# Patient Record
Sex: Female | Born: 1945 | Race: White | Hispanic: No | Smoking: Never smoker
Health system: Southern US, Community
[De-identification: ages and names within clinical notes are randomized; demographics above are authoritative.]

## PROBLEM LIST (undated history)

## (undated) DIAGNOSIS — J45909 Unspecified asthma, uncomplicated: Secondary | ICD-10-CM

## (undated) DIAGNOSIS — G20A1 Parkinson's disease without dyskinesia, without mention of fluctuations: Secondary | ICD-10-CM

## (undated) DIAGNOSIS — I2510 Atherosclerotic heart disease of native coronary artery without angina pectoris: Secondary | ICD-10-CM

## (undated) DIAGNOSIS — G20 Parkinson's disease: Secondary | ICD-10-CM

## (undated) DIAGNOSIS — G4730 Sleep apnea, unspecified: Secondary | ICD-10-CM

## (undated) DIAGNOSIS — I5033 Acute on chronic diastolic (congestive) heart failure: Principal | ICD-10-CM

## (undated) DIAGNOSIS — N1831 Chronic kidney disease, stage 3a (HCC): Principal | ICD-10-CM

## (undated) DIAGNOSIS — R531 Weakness: Secondary | ICD-10-CM

## (undated) DIAGNOSIS — R0789 Other chest pain: Secondary | ICD-10-CM

## (undated) HISTORY — PX: REPLACEMENT TOTAL KNEE: SUR1224

---

## 2017-09-01 ENCOUNTER — Emergency Department (HOSPITAL_COMMUNITY): Payer: Medicare Other

## 2017-09-01 ENCOUNTER — Encounter (HOSPITAL_COMMUNITY): Payer: Self-pay | Admitting: Emergency Medicine

## 2017-09-01 DIAGNOSIS — R079 Chest pain, unspecified: Secondary | ICD-10-CM | POA: Insufficient documentation

## 2017-09-01 DIAGNOSIS — Z5321 Procedure and treatment not carried out due to patient leaving prior to being seen by health care provider: Secondary | ICD-10-CM | POA: Diagnosis not present

## 2017-09-01 LAB — BASIC METABOLIC PANEL
Anion gap: 9 (ref 5–15)
BUN: 22 mg/dL — AB (ref 6–20)
CALCIUM: 8.9 mg/dL (ref 8.9–10.3)
CHLORIDE: 103 mmol/L (ref 101–111)
CO2: 26 mmol/L (ref 22–32)
CREATININE: 1.02 mg/dL — AB (ref 0.44–1.00)
GFR calc non Af Amer: 53 mL/min — ABNORMAL LOW (ref >60)
Glucose, Bld: 111 mg/dL — ABNORMAL HIGH (ref 65–99)
Potassium: 4 mmol/L (ref 3.5–5.1)
SODIUM: 138 mmol/L (ref 135–145)

## 2017-09-01 LAB — CBC
HCT: 40.8 % (ref 36.0–46.0)
Hemoglobin: 13.2 g/dL (ref 12.0–15.0)
MCH: 26.4 pg (ref 26.0–34.0)
MCHC: 32.4 g/dL (ref 30.0–36.0)
MCV: 81.6 fL (ref 78.0–100.0)
PLATELETS: 236 10*3/uL (ref 150–400)
RBC: 5 MIL/uL (ref 3.87–5.11)
RDW: 15.9 % — AB (ref 11.5–15.5)
WBC: 6.5 10*3/uL (ref 4.0–10.5)

## 2017-09-01 LAB — I-STAT TROPONIN, ED: TROPONIN I, POC: 0.01 ng/mL (ref 0.00–0.08)

## 2017-09-01 NOTE — ED Triage Notes (Signed)
Pt c/0 5/10 bilateral chest tightness, pt states she things is related to an allergic reaction, but she doesn't know what she is allergic to. Pt took 2 Benadryl pta and her BP medication with no relief.

## 2017-09-02 ENCOUNTER — Emergency Department (HOSPITAL_COMMUNITY)
Admission: EM | Admit: 2017-09-02 | Discharge: 2017-09-02 | Disposition: A | Discharge status: Home / Self Care | Payer: Medicare Other | Attending: Emergency Medicine | Admitting: Emergency Medicine

## 2017-09-02 HISTORY — DX: Sleep apnea, unspecified: G47.30

## 2017-09-02 HISTORY — DX: Unspecified asthma, uncomplicated: J45.909

## 2017-09-02 HISTORY — DX: Parkinson's disease: G20

## 2017-09-02 HISTORY — DX: Atherosclerotic heart disease of native coronary artery without angina pectoris: I25.10

## 2017-09-02 HISTORY — DX: Parkinson's disease without dyskinesia, without mention of fluctuations: G20.A1

## 2017-09-02 NOTE — ED Notes (Signed)
No response when called to room.  

## 2017-09-02 NOTE — ED Notes (Signed)
Pt called for a room, no response 

## 2018-10-26 IMAGING — DX DG CHEST 2V
2 series · 2 of 2 positions shown · non-contrast
Comparison: None.

CLINICAL DATA: Pt complains of [DATE] bilateral chest tightness that
started tonight. Pt states she thinks it is related to an allergic
reaction, but she doesn't know what she is allergic to. Hx of CAD
and asthma. Non-smoker.

EXAM:
CHEST  2 VIEW

[chest lat]
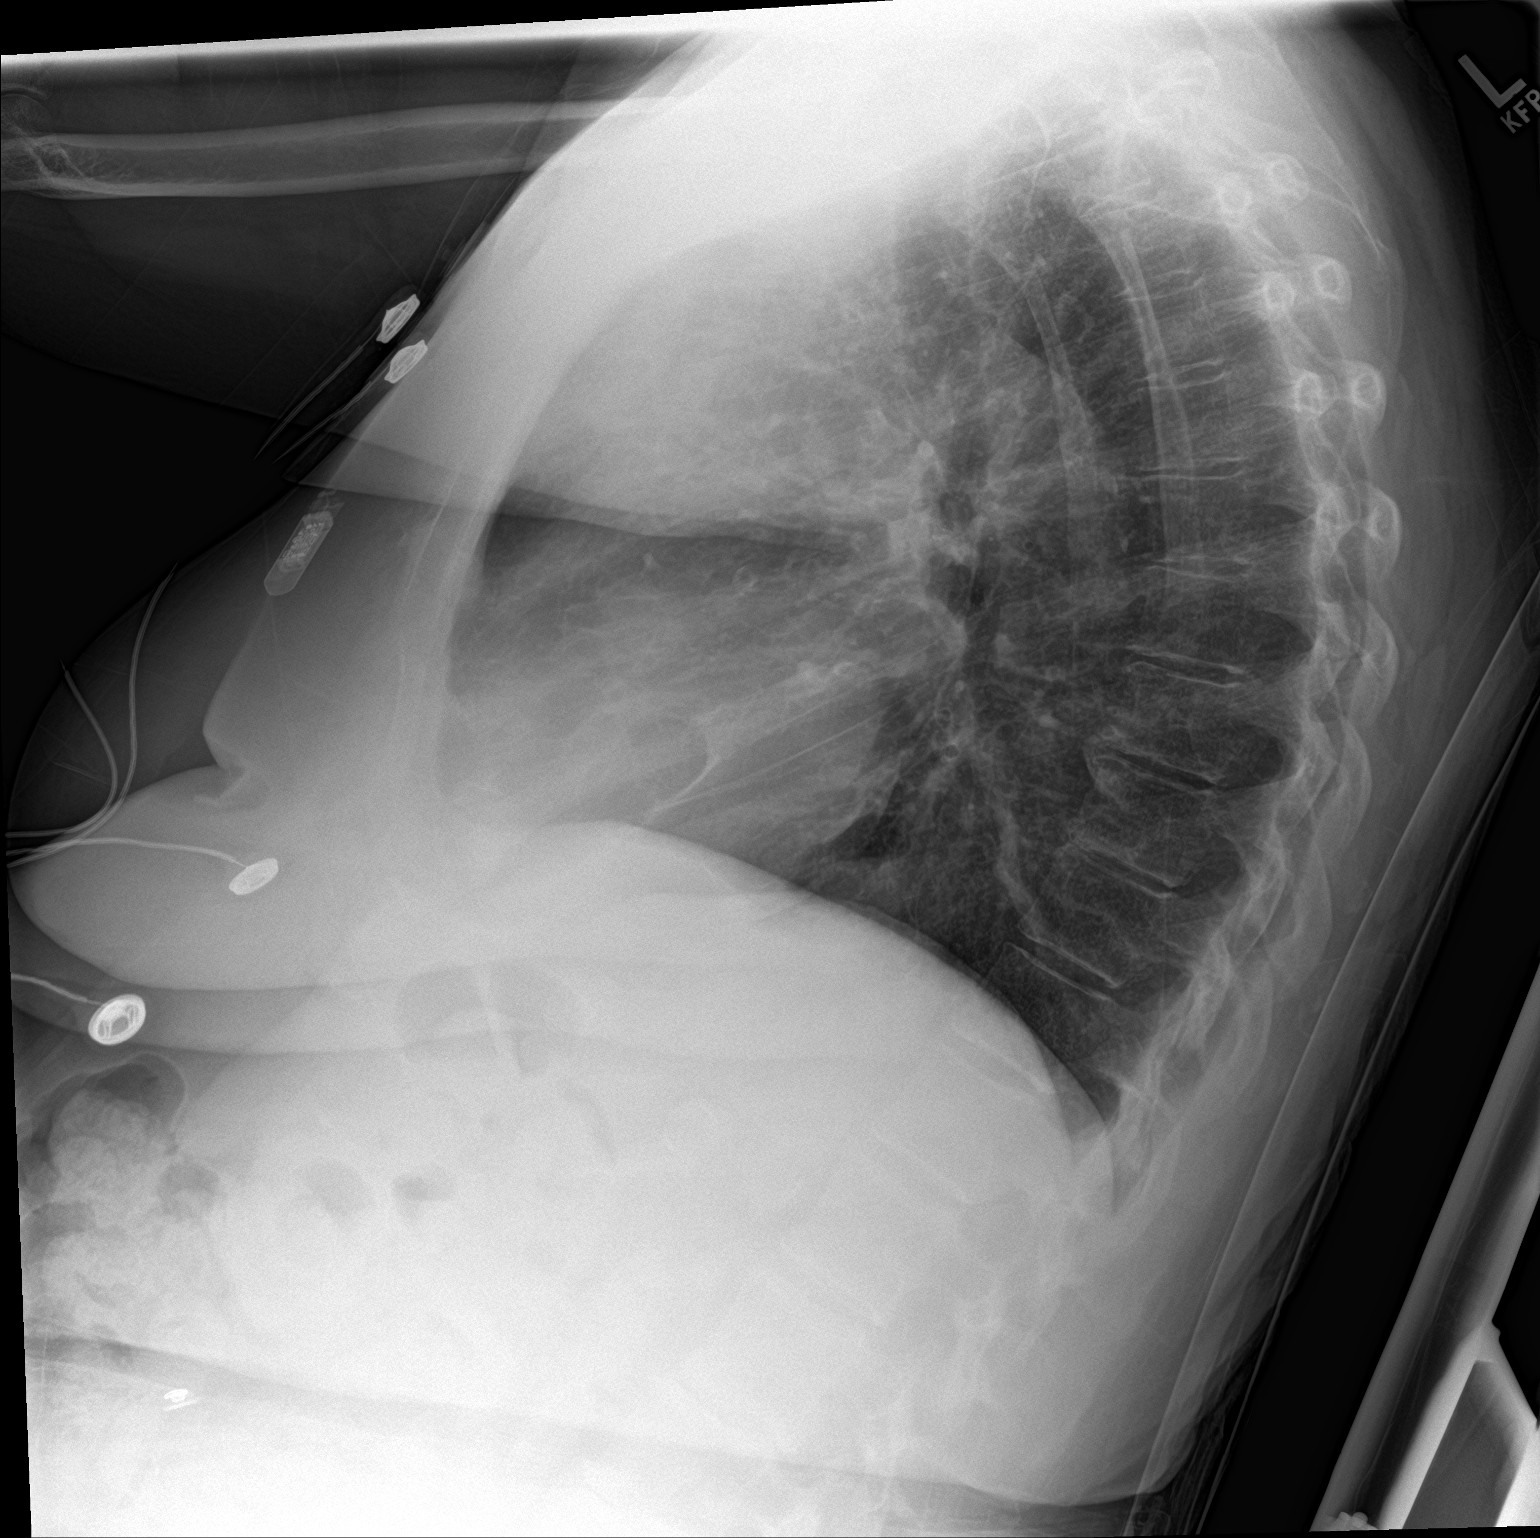

[chest ap]
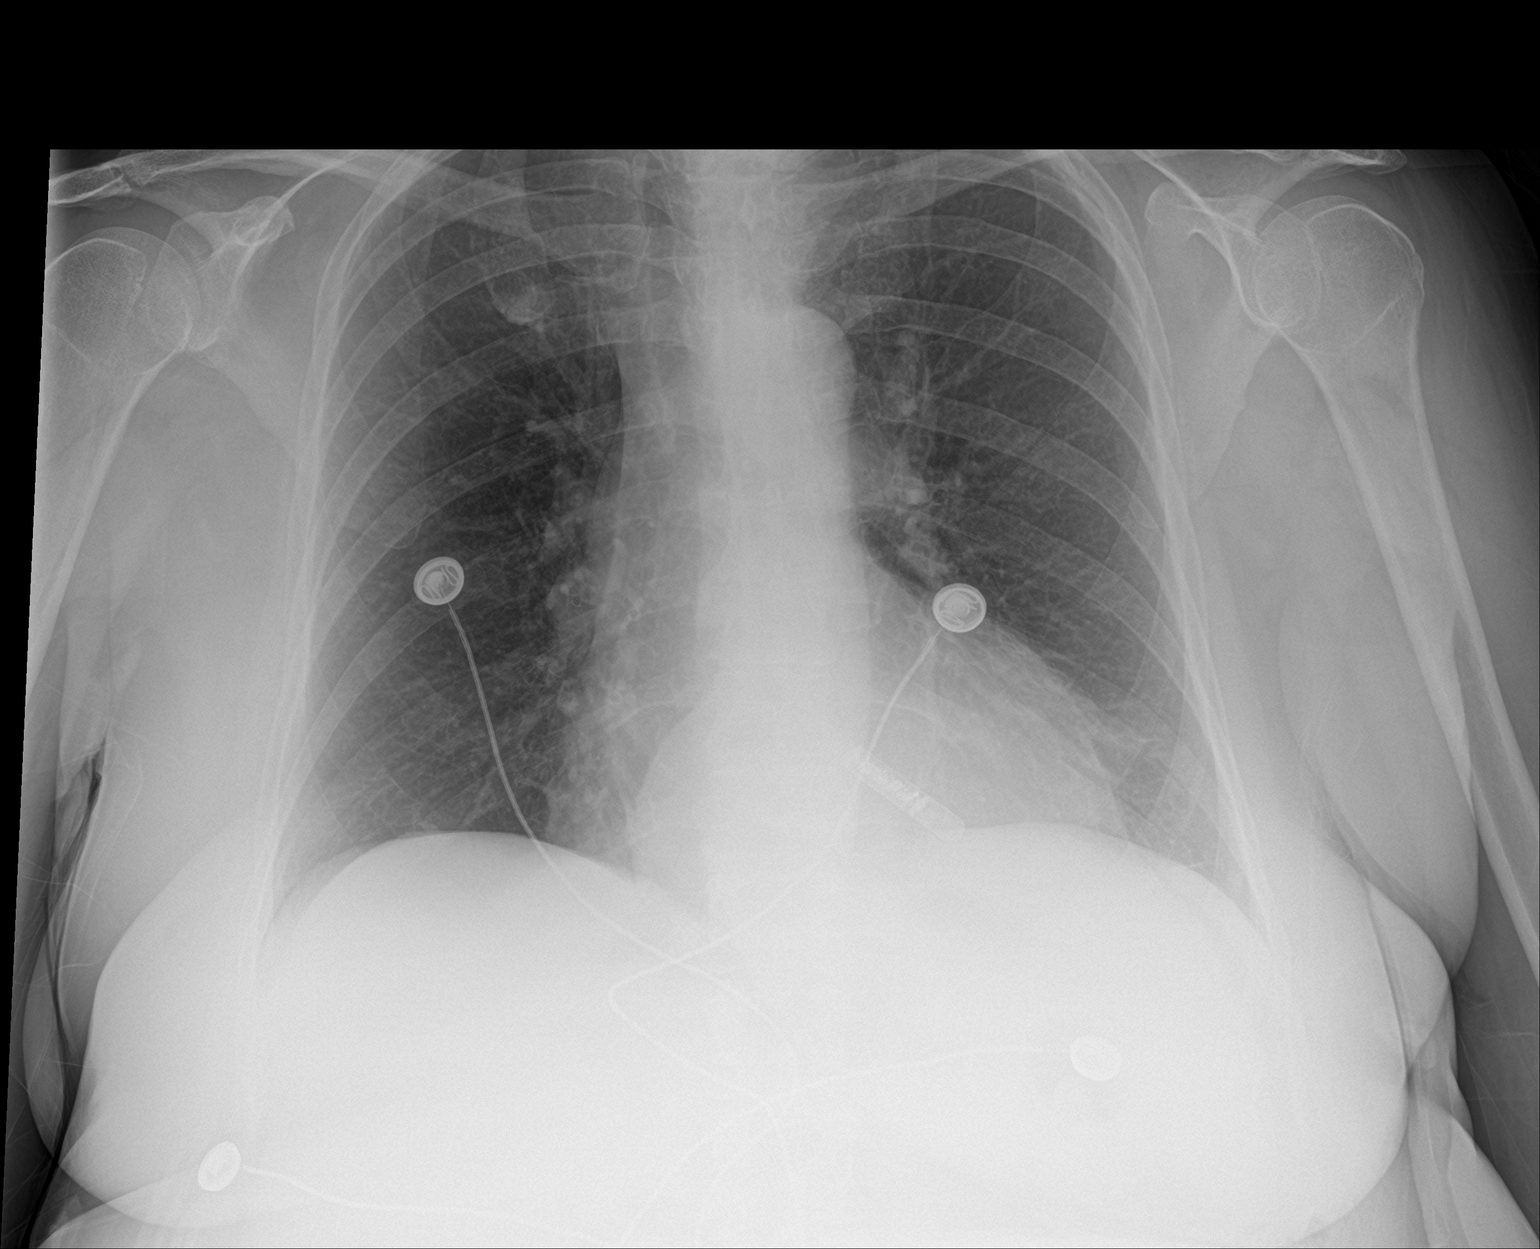

[2 of 2 positions shown; findings below may reference images not displayed]

FINDINGS: Normal cardiac silhouette with ectatic aorta. Loop recorder device
noted. Lungs are clear. No effusion, infiltrate pneumothorax.
IMPRESSION: No acute cardiopulmonary process.

## 2019-01-29 ENCOUNTER — Encounter: Attending: Family Medicine | Primary: Family Medicine

## 2020-04-29 ENCOUNTER — Ambulatory Visit: Admit: 2020-04-29 | Discharge: 2020-04-29 | Payer: MEDICARE | Attending: Family Medicine | Primary: Family Medicine

## 2020-04-29 ENCOUNTER — Ambulatory Visit: Admit: 2020-04-29 | Discharge: 2020-05-03 | Payer: MEDICARE | Primary: Family Medicine

## 2020-04-29 ENCOUNTER — Encounter

## 2020-04-29 DIAGNOSIS — I5033 Acute on chronic diastolic (congestive) heart failure: Secondary | ICD-10-CM

## 2020-04-29 DIAGNOSIS — R531 Weakness: Secondary | ICD-10-CM

## 2020-04-29 MED ORDER — CLOPIDOGREL BISULFATE 75 MG PO TABS
75 MG | ORAL_TABLET | ORAL | 1 refills | Status: DC
Start: 2020-04-29 — End: 2021-01-17

## 2020-04-29 MED ORDER — RESPIRATORY THERAPY SUPPLIES DEVI
Freq: Every day | 0 refills | Status: DC
Start: 2020-04-29 — End: 2021-05-16

## 2020-04-29 MED ORDER — TORSEMIDE 20 MG PO TABS
20 MG | ORAL_TABLET | ORAL | 1 refills | Status: DC
Start: 2020-04-29 — End: 2021-01-17

## 2020-04-29 MED ORDER — CARBIDOPA-LEVODOPA 25-100 MG PO TABS
25-100 MG | ORAL_TABLET | Freq: Three times a day (TID) | ORAL | 1 refills | Status: DC
Start: 2020-04-29 — End: 2020-11-22

## 2020-04-29 MED ORDER — SPIRONOLACTONE 25 MG PO TABS
25 MG | ORAL_TABLET | ORAL | 1 refills | Status: DC
Start: 2020-04-29 — End: 2021-01-17

## 2020-04-30 LAB — LIPID PANEL
Cholesterol, Total: 217 mg/dL — ABNORMAL HIGH (ref 0–199)
HDL: 48 mg/dL (ref >40)
LDL Calculated: 127 mg/dL — ABNORMAL HIGH (ref 0–99)
Triglycerides: 212 mg/dL — ABNORMAL HIGH (ref 0–149)
VLDL Cholesterol Calculated: 42 mg/dL

## 2020-04-30 LAB — VITAMIN D 25 HYDROXY: Vit D, 25-Hydroxy: 25 ng/mL — ABNORMAL LOW (ref 30–100)

## 2020-04-30 LAB — COMPREHENSIVE METABOLIC PANEL
ALT: <5 U/L (ref 0–32)
AST: 15 U/L (ref 0–31)
Albumin: 4.1 g/dL (ref 3.5–5.2)
Alkaline Phosphatase: 87 U/L (ref 35–104)
Anion Gap: 16 mmol/L (ref 7–16)
BUN: 26 mg/dL — ABNORMAL HIGH (ref 6–23)
CO2: 23 mmol/L (ref 22–29)
Calcium: 9.3 mg/dL (ref 8.6–10.2)
Chloride: 103 mmol/L (ref 98–107)
Creatinine: 1.1 mg/dL — ABNORMAL HIGH (ref 0.5–1.0)
GFR African American: 58
GFR Non-African American: 48 mL/min/{1.73_m2} (ref >=60)
Glucose: 104 mg/dL — ABNORMAL HIGH (ref 74–99)
Potassium: 4.8 mmol/L (ref 3.5–5.0)
Sodium: 142 mmol/L (ref 132–146)
Total Bilirubin: 0.4 mg/dL (ref 0.0–1.2)
Total Protein: 7.4 g/dL (ref 6.4–8.3)

## 2020-04-30 LAB — CBC
Hematocrit: 45.2 % (ref 34.0–48.0)
Hemoglobin: 14 g/dL (ref 11.5–15.5)
MCH: 26.7 pg (ref 26.0–35.0)
MCHC: 31 % — ABNORMAL LOW (ref 32.0–34.5)
MCV: 86.1 fL (ref 80.0–99.9)
MPV: 10.1 fL (ref 7.0–12.0)
Platelets: 286 E9/L (ref 130–450)
RBC: 5.25 E12/L (ref 3.50–5.50)
RDW: 14.3 fL (ref 11.5–15.0)
WBC: 8 E9/L (ref 4.5–11.5)

## 2020-04-30 LAB — TSH: TSH: 0.947 u[IU]/mL (ref 0.270–4.200)

## 2020-04-30 LAB — VITAMIN B12: Vitamin B-12: 628 pg/mL (ref 211–946)

## 2020-05-03 ENCOUNTER — Encounter

## 2020-05-27 ENCOUNTER — Inpatient Hospital Stay: Admit: 2020-05-27 | Discharge: 2020-05-27 | Payer: MEDICARE | Primary: Family Medicine

## 2020-05-27 DIAGNOSIS — I5033 Acute on chronic diastolic (congestive) heart failure: Secondary | ICD-10-CM

## 2020-05-27 LAB — BRAIN NATRIURETIC PEPTIDE: Pro-BNP: 552 pg/mL — ABNORMAL HIGH (ref 0–450)

## 2020-05-27 LAB — BASIC METABOLIC PANEL
Anion Gap: 8 mmol/L (ref 7–16)
BUN: 23 mg/dL (ref 6–23)
CO2: 29 mmol/L (ref 22–29)
Calcium: 9.4 mg/dL (ref 8.6–10.2)
Chloride: 100 mmol/L (ref 98–107)
Creatinine: 1.1 mg/dL — ABNORMAL HIGH (ref 0.5–1.0)
GFR African American: 58
GFR Non-African American: 48 mL/min/{1.73_m2} (ref >=60)
Glucose: 100 mg/dL — ABNORMAL HIGH (ref 74–99)
Potassium: 4 mmol/L (ref 3.5–5.0)
Sodium: 137 mmol/L (ref 132–146)

## 2020-05-27 NOTE — Progress Notes (Signed)
Caitlyn Spencer   Caitlyn Spencer  [id number]          Heide Brossart   1945-12-19            Referring Doctor: Huntley Estelle  Primary Care Physician: Huntley Estelle  Cardiologist:   Nephrologist:         History of Present Illness:     Caitlyn Spencer is a 74 y.o. female with a history of HFpEF, most recent EF 47% at [facility].    Patient Story:    She does not  have dyspnea with exertion, shortness of breath, or decline in overall functional capacity. She does not have orthopnea, PND, nocturnal cough or hemoptysis. She does not have abdominal distention or bloating, early satiety, anorexia/change in appetite. She does has a good urinary response to  oral diuretic. She does have  lower extremity edema. She denies lightheadedness, dizziness. She denies palpitations, syncope or near syncope. She does not complain of chest pain, pressure, discomfort.         Allergies   Allergen Reactions   ??? Iodides    ??? Amoxicillin-Pot Clavulanate Swelling     Lips & Tongue   ??? Apixaban Swelling     Edema and petichea   ??? Sulfamethoxazole          No outpatient medications have been marked as taking for the 05/27/20 encounter [facility] Encounter) with [facility] CHF ROOM 1.           Guideline directed medical:  ARNI/ACE I/ARB: No  Beta blocker:  No  Aldosterone antagonist:  Yes        Physical Examination:     BP (!) 148/73    Pulse 58    Resp 16    Wt 229 lb 6.4 oz (104.1 kg)    SpO2 96%    BMI 38.17 kg/m??     Assessment  Charting Type: Shift assessment (CHF Spencer)    Neurological  Level of Consciousness: Alert (0)         HEENT  HEENT (WDL): Exceptions to WDL  Right Eye: Impaired vision  Left Eye: Impaired vision                   Cardiac  Cardiac Rhythm: Sinus bradycardia    Rhythm Interpretation  Pulse: 58         Gastrointestinal  Abdominal (WDL): Within Defined Limits  Abdomen Inspection: Soft, Rounded               Peripheral Vascular  Peripheral Vascular (WDL): Exceptions to WDL  Edema: Right lower extremity, Left lower  extremity  RLE Edema: Trace  LLE Edema: Trace                   Genitourinary  Genitourinary (WDL): Within Defined Limits                             Pulse: 58                   LAB DATA:    BNP  No results for input(s): BNP in the last 72 hours.    proBNP  Recent Labs     05/27/20  1407   PROBNP 552*       BMP  Recent Labs     05/27/20  1407   NA 137   K 4.0   CL 100   CO2 29  BUN 23   CREATININE 1.1*   GLUCOSE 100*   CALCIUM 9.4         WEIGHTS:  Wt Readings from Last 3 Encounters:   05/27/20 229 lb 6.4 oz (104.1 kg)   04/29/20 243 lb (110.2 kg)         TELEMETRY:  Cardiac Regularity: Regular  Cardiac Rhythm/Interpretation: NSR          ASSESSMENT:  [name] is evolemic with stable weights     Interventions completed this visit:  IV diuretics given no  Lab work obtained yes, BNP, BMP   Reviewed  current medications that patient is prescribed and answered any questions   Educated on signs and symptoms of HF  Educated on low sodium diet  Explained CHF Spencer and it's benefits. Patient says she currently does not have a cardiologist and stated she would prefer to wait to set up a follow up CHF Spencer visit and will call for further visits once she has been established with a cardiologist. Referred by PCP.    PLAN:    Given Spencer phone number [phone] and aware of signs and symptoms to call with any HF change in symptoms.

## 2020-05-27 NOTE — Plan of Care (Signed)
Problem: Fluid Volume:  Goal: Risk for excess fluid volume will decrease  Outcome: Ongoing  Continue plan

## 2020-05-31 ENCOUNTER — Ambulatory Visit: Admit: 2020-05-31 | Discharge: 2020-05-31 | Payer: MEDICARE | Attending: Family Medicine | Primary: Family Medicine

## 2020-05-31 DIAGNOSIS — G20 Parkinson's disease: Secondary | ICD-10-CM

## 2020-05-31 MED ORDER — CLOBETASOL PROPIONATE 0.05 % EX OINT
0.05 % | CUTANEOUS | 0 refills | Status: AC
Start: 2020-05-31 — End: ?

## 2020-08-09 MED ORDER — SPIRIVA HANDIHALER 18 MCG IN CAPS
18 MCG | ORAL_CAPSULE | Freq: Every day | RESPIRATORY_TRACT | 5 refills | Status: DC
Start: 2020-08-09 — End: 2021-05-16

## 2020-08-09 NOTE — Telephone Encounter (Signed)
Last Appointment:  05/31/2020  Future Appointments   Date Time Provider Department Center   09/06/2020  2:15 PM Salome Arnt, MD [facility]   09/06/2020  2:30 PM Salome Arnt, MD [facility]

## 2020-09-02 ENCOUNTER — Encounter: Attending: Family Medicine | Primary: Family Medicine

## 2020-09-06 ENCOUNTER — Encounter: Attending: Family Medicine | Primary: Family Medicine

## 2020-09-07 ENCOUNTER — Encounter

## 2020-09-08 LAB — BASIC METABOLIC PANEL
Anion Gap: 13 mmol/L (ref 7–16)
BUN: 22 mg/dL (ref 6–23)
CO2: 27 mmol/L (ref 22–29)
Calcium: 9.2 mg/dL (ref 8.6–10.2)
Chloride: 101 mmol/L (ref 98–107)
Creatinine: 1.1 mg/dL — ABNORMAL HIGH (ref 0.5–1.0)
GFR African American: 58
GFR Non-African American: 48 mL/min/{1.73_m2} (ref >=60)
Glucose: 133 mg/dL — ABNORMAL HIGH (ref 74–99)
Potassium: 4.3 mmol/L (ref 3.5–5.0)
Sodium: 141 mmol/L (ref 132–146)

## 2020-09-09 ENCOUNTER — Ambulatory Visit: Admit: 2020-09-09 | Discharge: 2020-09-09 | Payer: MEDICARE | Attending: Family Medicine | Primary: Family Medicine

## 2020-09-09 DIAGNOSIS — I480 Paroxysmal atrial fibrillation: Secondary | ICD-10-CM

## 2020-09-09 MED ORDER — PANTOPRAZOLE SODIUM 40 MG PO TBEC
40 MG | ORAL_TABLET | Freq: Every day | ORAL | 5 refills | Status: DC
Start: 2020-09-09 — End: 2021-05-16

## 2020-09-09 MED ORDER — BREO ELLIPTA 200-25 MCG/INH IN AEPB
200-25 MCG/INH | RESPIRATORY_TRACT | 5 refills | Status: DC
Start: 2020-09-09 — End: 2021-04-28

## 2020-09-14 ENCOUNTER — Telehealth

## 2020-09-14 NOTE — Telephone Encounter (Signed)
Patient called and wanted to know if she can get a nebulizer machine with the solution sent to [facility]?

## 2020-09-15 MED ORDER — NEBULIZER/TUBING/MOUTHPIECE KIT
PACK | Freq: Every day | 0 refills | Status: DC | PRN
Start: 2020-09-15 — End: 2021-05-16

## 2020-09-15 MED ORDER — ALBUTEROL SULFATE (5 MG/ML) 0.5% IN NEBU
Freq: Four times a day (QID) | RESPIRATORY_TRACT | 3 refills | Status: DC | PRN
Start: 2020-09-15 — End: 2021-05-16

## 2020-09-15 NOTE — Telephone Encounter (Signed)
Spoke to patient and she verbalized understanding on the breo. Patient stated she would like the albuterol solution to use with the machine.

## 2020-09-15 NOTE — Telephone Encounter (Signed)
Yes is she wanting Albuterol solution to use with it? For Breo she would still need to use inhaler

## 2020-11-22 MED ORDER — CARBIDOPA-LEVODOPA 25-100 MG PO TABS
25-100 MG | ORAL_TABLET | ORAL | 5 refills | Status: DC
Start: 2020-11-22 — End: 2020-11-23

## 2020-11-22 NOTE — Telephone Encounter (Signed)
Last Appointment:  09/09/2020  No future appointments.     Caitlyn Spencer asking rx be sent today. She is out.

## 2020-11-23 MED ORDER — CARBIDOPA-LEVODOPA 25-100 MG PO TABS
25-100 MG | ORAL_TABLET | ORAL | 1 refills | Status: DC
Start: 2020-11-23 — End: 2021-05-16

## 2020-11-23 NOTE — Telephone Encounter (Signed)
Last Appointment:  09/09/2020  No future appointments.

## 2021-01-17 MED ORDER — CLOPIDOGREL BISULFATE 75 MG PO TABS
75 MG | ORAL_TABLET | ORAL | 1 refills | Status: DC
Start: 2021-01-17 — End: 2021-05-16

## 2021-01-17 MED ORDER — TORSEMIDE 20 MG PO TABS
20 MG | ORAL_TABLET | ORAL | 1 refills | Status: DC
Start: 2021-01-17 — End: 2021-05-16

## 2021-01-17 MED ORDER — SPIRONOLACTONE 25 MG PO TABS
25 MG | ORAL_TABLET | ORAL | 1 refills | Status: DC
Start: 2021-01-17 — End: 2021-07-11

## 2021-01-17 NOTE — Telephone Encounter (Signed)
Last Appointment:  09/09/2020  No future appointments.

## 2021-04-23 ENCOUNTER — Encounter

## 2021-04-27 ENCOUNTER — Encounter

## 2021-04-27 NOTE — Telephone Encounter (Signed)
Last Appointment:  09/09/2020  No future appointments.

## 2021-04-27 NOTE — Telephone Encounter (Signed)
Patient would like pharmacy changed to [facility]

## 2021-04-28 MED ORDER — BREO ELLIPTA 200-25 MCG/INH IN AEPB
200-25 MCG/INH | RESPIRATORY_TRACT | 0 refills | Status: DC
Start: 2021-04-28 — End: 2021-05-16

## 2021-05-16 ENCOUNTER — Ambulatory Visit: Admit: 2021-05-16 | Discharge: 2021-05-16 | Payer: MEDICARE | Attending: Family Medicine | Primary: Family Medicine

## 2021-05-16 ENCOUNTER — Observation Stay
Admit: 2021-05-17 | Discharge: 2021-05-18 | Disposition: A | Discharge status: Home / Self Care | Payer: MEDICARE | Attending: Emergency Medicine | Admitting: Emergency Medicine

## 2021-05-16 ENCOUNTER — Emergency Department: Admit: 2021-05-17 | Payer: MEDICARE | Primary: Family Medicine

## 2021-05-16 DIAGNOSIS — R079 Chest pain, unspecified: Secondary | ICD-10-CM

## 2021-05-16 LAB — CBC
Hematocrit: 42.6 % (ref 34.0–48.0)
Hemoglobin: 13.3 g/dL (ref 11.5–15.5)
MCH: 25 pg — ABNORMAL LOW (ref 26.0–35.0)
MCHC: 31.2 % — ABNORMAL LOW (ref 32.0–34.5)
MCV: 80.1 fL (ref 80.0–99.9)
MPV: 9.2 fL (ref 7.0–12.0)
Platelets: 321 E9/L (ref 130–450)
RBC: 5.32 E12/L (ref 3.50–5.50)
RDW: 14.7 fL (ref 11.5–15.0)
WBC: 12 E9/L — ABNORMAL HIGH (ref 4.5–11.5)

## 2021-05-16 LAB — TROPONIN: Troponin, High Sensitivity: 9 ng/L (ref 0–9)

## 2021-05-16 LAB — BASIC METABOLIC PANEL
Anion Gap: 7 mmol/L (ref 7–16)
BUN: 22 mg/dL (ref 6–23)
CO2: 27 mmol/L (ref 22–29)
Calcium: 8.9 mg/dL (ref 8.6–10.2)
Chloride: 102 mmol/L (ref 98–107)
Creatinine: 1.2 mg/dL — ABNORMAL HIGH (ref 0.5–1.0)
GFR African American: 53
GFR Non-African American: 44 mL/min/{1.73_m2} (ref >=60)
Glucose: 111 mg/dL — ABNORMAL HIGH (ref 74–99)
Potassium: 4.6 mmol/L (ref 3.5–5.0)
Sodium: 136 mmol/L (ref 132–146)

## 2021-05-16 LAB — APTT: aPTT: 29.1 s (ref 24.5–35.1)

## 2021-05-16 LAB — PROTIME-INR
INR: 1.1
Protime: 11.6 s (ref 9.3–12.4)

## 2021-05-16 MED ORDER — CARBIDOPA-LEVODOPA 25-100 MG PO TABS
25-100 MG | ORAL_TABLET | ORAL | 1 refills | Status: AC
Start: 2021-05-16 — End: ?

## 2021-05-16 MED ORDER — ALBUTEROL SULFATE HFA 108 (90 BASE) MCG/ACT IN AERS
108 | RESPIRATORY_TRACT | 1 refills | Status: AC | PRN
Start: 2021-05-16 — End: ?

## 2021-05-16 MED ORDER — RAMIPRIL 2.5 MG PO CAPS
2.5 | ORAL_CAPSULE | ORAL | 0 refills | Status: AC | PRN
Start: 2021-05-16 — End: ?

## 2021-05-16 MED ORDER — PANTOPRAZOLE SODIUM 40 MG PO TBEC
40 MG | ORAL_TABLET | Freq: Every day | ORAL | 1 refills | Status: DC
Start: 2021-05-16 — End: 2021-05-18

## 2021-05-16 MED ORDER — NORMAL SALINE FLUSH 0.9 % IV SOLN
0.9 % | INTRAVENOUS | Status: DC | PRN
Start: 2021-05-16 — End: 2021-05-18

## 2021-05-16 MED ORDER — SPIRIVA HANDIHALER 18 MCG IN CAPS
18 MCG | ORAL_CAPSULE | Freq: Every day | RESPIRATORY_TRACT | 1 refills | Status: AC
Start: 2021-05-16 — End: ?

## 2021-05-16 MED ORDER — TORSEMIDE 20 MG PO TABS
20 MG | ORAL_TABLET | ORAL | 1 refills | Status: DC
Start: 2021-05-16 — End: 2021-07-11

## 2021-05-16 MED ORDER — FLUTICASONE FUROATE-VILANTEROL 200-25 MCG/INH IN AEPB
200-25 MCG/INH | Freq: Every day | RESPIRATORY_TRACT | 1 refills | Status: AC
Start: 2021-05-16 — End: ?

## 2021-05-16 MED ORDER — CLOPIDOGREL BISULFATE 75 MG PO TABS
75 MG | ORAL_TABLET | ORAL | 1 refills | Status: DC
Start: 2021-05-16 — End: 2021-07-11

## 2021-05-16 NOTE — ED Provider Notes (Signed)
[facility]  Department of Emergency Medicine     Written by: Hyman Bower, DO  Patient Name: Fontaine No  Attending Provider: Mordecai Maes, MD  Admit Date: 05/17/2021  4:34 AM  MRN: [id number]                   DOB: Aug 01, 1946        Chief Complaint   Patient presents with   ??? Shortness of Breath     feeling SOB for awhile, last night stated she started to have CP and pain across her shoulders, extensive hx of MI and dissection   ??? Chest Pain    - Chief complaint    Patient is a 75 year old female past med history of CAD.  Patient presents with chief complaint shortness of breath and chest pain.  Patient states that for the last couple days she has had gradually worsening shortness of breath as well as chest pain.  Patient states that chest pain is a dull aching sensation.  She does note some radiation to her shoulders.  Patient states that symptoms have been moderate in severity and constant onset she denies any exacerbating relieving factors.  Patient notes that she did have a similar episode in the past prior to her previous MI.  Patient denies any fevers, chills, nausea, vomiting, abdominal pain, constipation or diarrhea.    The history is provided by the patient. No language interpreter was used.        Review of Systems   Constitutional: Negative for chills and fever.   HENT: Negative for ear pain, sinus pressure and sore throat.    Eyes: Negative for pain, discharge and redness.   Respiratory: Positive for shortness of breath. Negative for cough and wheezing.    Cardiovascular: Positive for chest pain.   Gastrointestinal: Negative for abdominal distention, diarrhea, nausea and vomiting.   Genitourinary: Negative for dysuria and frequency.   Musculoskeletal: Negative for arthralgias and back pain.   Skin: Negative for rash and wound.   Neurological: Negative for weakness and headaches.   Hematological: Negative for adenopathy.   All other systems reviewed and are negative.        Physical Exam  Vitals and nursing note reviewed.   Constitutional:       General: She is not in acute distress.     Appearance: Normal appearance.   HENT:      Head: Normocephalic and atraumatic.      Nose: No congestion or rhinorrhea.      Mouth/Throat:      Mouth: Mucous membranes are moist.      Pharynx: Oropharynx is clear.   Eyes:      Extraocular Movements: Extraocular movements intact.      Pupils: Pupils are equal, round, and reactive to light.   Cardiovascular:      Rate and Rhythm: Normal rate and regular rhythm.      Heart sounds: No murmur heard.  No gallop.    Pulmonary:      Effort: Pulmonary effort is normal. No respiratory distress.      Breath sounds: No wheezing, rhonchi or rales.   Abdominal:      General: Abdomen is flat.      Palpations: Abdomen is soft. There is no mass.      Tenderness: There is no abdominal tenderness. There is no guarding.      Hernia: No hernia is present.   Musculoskeletal:  General: No swelling, tenderness or signs of injury. Normal range of motion.      Cervical back: Normal range of motion. No rigidity. No muscular tenderness.   Skin:     General: Skin is warm and dry.      Capillary Refill: Capillary refill takes less than 2 seconds.   Neurological:      General: No focal deficit present.      Mental Status: She is alert and oriented to person, place, and time. Mental status is at baseline.   Psychiatric:         Mood and Affect: Mood normal.         Behavior: Behavior normal.          Procedures       MDM  Number of Diagnoses or Management Options  Chest pain, unspecified type  Diagnosis management comments: Patient is a [age] year old female past med history of CAD.  Patient presents with chief complaint of shortness of breath and chest pain.  Vital signs stable presentation.  Physical exam heart regular rate and rhythm, lungs clear to auscultation bilaterally, abdomen soft nontender no rigidity rebound or guarding.  EKG obtained demonstrate no acute  ischemic changes.  Laboratory work obtained CBC unremarkable, BMP demonstrated mildly of a creatinine 1.3, INR 1.1, APTT 29.1.  Troponin was obtained initially 9 on repeat 10.  Chest x-ray obtained demonstrate no acute abnormalities.  Plan consistent with chest pain in the setting of previous CAD.  EKG reassuring troponin reassuring however decision made to admit patient for chest pain work-up.  Patient given aspirin.  Case discussed with hospitalist who agreed to admit patient.  Plan of care discussed with patient include admission, all questions were answered, patient was in agreement plan of care and admitted to the hospital in stable condition.       Amount and/or Complexity of Data Reviewed  Clinical lab tests: reviewed  Tests in the radiology section of CPT??: reviewed  Tests in the medicine section of CPT??: reviewed  Decide to obtain previous medical records or to obtain history from someone other than the patient: yes    Risk of Complications, Morbidity, and/or Mortality  Presenting problems: moderate  Diagnostic procedures: moderate  Management options: moderate    Patient Progress  Patient progress: stable           --------------------------------------------- PAST HISTORY ---------------------------------------------  Past Medical History:  has a past medical history of CAD (coronary artery disease).    Past Surgical History:  has no past surgical history on file.    Social History:  reports that she has never smoked. She has never used smokeless tobacco. She reports previous alcohol use. She reports previous drug use.    Family History: family history is not on file.     The patient???s home medications have been reviewed.    Allergies: Iodides, Amoxicillin-pot clavulanate, Apixaban, Sulfamethoxazole, and Vagisil anti-itch medicated [pramoxine hcl]    -------------------------------------------------- RESULTS -------------------------------------------------    LABS:  Results for orders placed or performed  during the hospital encounter of 05/17/21   Basic metabolic panel   Result Value Ref Range    Sodium 136 132 - 146 mmol/L    Potassium 4.6 3.5 - 5.0 mmol/L    Chloride 102 98 - 107 mmol/L    CO2 27 22 - 29 mmol/L    Anion Gap 7 7 - 16 mmol/L    Glucose 111 (H) 74 - 99 mg/dL    BUN 22 6 -  23 mg/dL    CREATININE 1.2 (H) 0.5 - 1.0 mg/dL    GFR Non-African American 44 >=60 mL/min/1.73    GFR African American 53     Calcium 8.9 8.6 - 10.2 mg/dL   CBC   Result Value Ref Range    WBC 12.0 (H) 4.5 - 11.5 E9/L    RBC 5.32 3.50 - 5.50 E12/L    Hemoglobin 13.3 11.5 - 15.5 g/dL    Hematocrit 16.142.6 09.634.0 - 48.0 %    MCV 80.1 80.0 - 99.9 fL    MCH 25.0 (L) 26.0 - 35.0 pg    MCHC 31.2 (L) 32.0 - 34.5 %    RDW 14.7 11.5 - 15.0 fL    Platelets 321 130 - 450 E9/L    MPV 9.2 7.0 - 12.0 fL   Troponin   Result Value Ref Range    Troponin, High Sensitivity 9 0 - 9 ng/L   Protime-INR   Result Value Ref Range    Protime 11.6 9.3 - 12.4 sec    INR 1.1    APTT   Result Value Ref Range    aPTT 29.1 24.5 - 35.1 sec   Troponin   Result Value Ref Range    Troponin, High Sensitivity 10 (H) 0 - 9 ng/L   EKG 12 Lead   Result Value Ref Range    Ventricular Rate 62 BPM    Atrial Rate 62 BPM    P-R Interval 246 ms    QRS Duration 134 ms    Q-T Interval 432 ms    QTc Calculation (Bazett) 438 ms    P Axis 59 degrees    R Axis 128 degrees    T Axis -31 degrees       RADIOLOGY:  CT CHEST WO CONTRAST   Final Result   Limited evaluation due to noncontrast technique.  No evidence acute aortic   abnormality.             EKG:  This EKG is signed and interpreted by me.    Rate: 62  Rhythm: Sinus  Interpretation: EKG obtained demonstrates sinus rhythm first-degree AV block, rate 62, normal axis, T wave inversions diffusely, no acute ST segment changes.  Comparison: stable as compared to patient's most recent EKG and no previous EKG available      ------------------------- NURSING NOTES AND VITALS REVIEWED ---------------------------  Date / Time Roomed:  05/17/2021   4:34 AM  ED Bed Assignment:  10/10    The nursing notes within the ED encounter and vital signs as below have been reviewed.     Patient Vitals for the past 24 hrs:   BP Temp Pulse Resp SpO2   05/17/21 0515 (!) 143/75 -- 59 15 97 %   05/17/21 0006 (!) 152/67 -- 64 -- 95 %   05/16/21 1656 (!) 150/68 -- -- 16 --   05/16/21 1652 -- 97.5 ??F (36.4 ??C) 69 -- 97 %       Oxygen Saturation Interpretation: Normal    ------------------------------------------ PROGRESS NOTES ------------------------------------------  Re-evaluation(s):  Time: 0420  Patient???s symptoms show no change  Repeat physical examination is not changed    Counseling:  I have spoken with the patient and discussed today???s results, in addition to providing specific details for the plan of care and counseling regarding the diagnosis and prognosis.  Their questions are answered at this time and they are agreeable with the plan of admission.    --------------------------------- ADDITIONAL PROVIDER NOTES ---------------------------------  Consultations:  Time: 0430. Spoke with Hospitalist.  Discussed case.  They will admit the patient.  This patient's ED course included: a personal history and physicial examination, re-evaluation prior to disposition and multiple bedside re-evaluations    This patient has remained hemodynamically stable during their ED course.    Diagnosis:  1. Chest pain, unspecified type        Disposition:  Patient's disposition: Admit to telemetry  Patient's condition is stable.        Patient was seen and evaluated by myself and my attending. Assessment and Plan discussed with attending provider, please see attestation for final plan of care.     Hyman Bower, DO        Soyla Dryer, DO  Resident  05/17/21 0700

## 2021-05-16 NOTE — Progress Notes (Signed)
[facility]    Neha Waight presents to the office today for   Chief Complaint   Patient presents with   ??? Medication Refill   ??? Shortness of Breath     ongoing for weeks, was given steroids that helped but did not complete   ??? Gastroesophageal Reflux   ??? Chest Pain   ??? Constipation     bm yesterday small and hard   ??? Hair/Scalp Problem     itching, flaking   ??? CPAP/BiPAP     [facility]     Chest pain and dyspnea  Last night with pain across her shoulders  She is short of breath and worsened with exertion  She does not have chest pain now but had some walking in  She does have known Coronary artery disease  Did not see Dr. Craig Guess as she was out of town  She is on Plavix and not Aspirin      Review of Systems     BP 134/60    Pulse 66    Temp 96.9 ??F (36.1 ??C) (Temporal)    Ht 5\' 5"  (1.651 m)    Wt 232 lb (105.2 kg)    SpO2 95%    BMI 38.61 kg/m??   Physical Exam  Constitutional:       Appearance: Normal appearance.   HENT:      Head: Normocephalic and atraumatic.   Eyes:      Extraocular Movements: Extraocular movements intact.      Conjunctiva/sclera: Conjunctivae normal.   Cardiovascular:      Rate and Rhythm: Normal rate.      Heart sounds: Normal heart sounds.   Pulmonary:      Effort: Pulmonary effort is normal.      Breath sounds: Normal breath sounds.   Skin:     General: Skin is warm.   Neurological:      Mental Status: She is alert and oriented to person, place, and time.   Psychiatric:         Mood and Affect: Mood normal.         Behavior: Behavior normal.            Current Outpatient Medications:   ???  torsemide (DEMADEX) 20 MG tablet, TAKE 1 TABLET BY MOUTH ONCE DAILY, Disp: 90 tablet, Rfl: 1  ???  tiotropium (SPIRIVA HANDIHALER) 18 MCG inhalation capsule, Inhale 1 capsule into the lungs daily, Disp: 90 capsule, Rfl: 1  ???  pantoprazole (PROTONIX) 40 MG tablet, Take 1 tablet by mouth daily, Disp: 90 tablet, Rfl: 1  ???  clopidogrel (PLAVIX) 75 MG tablet, TAKE 1 TABLET BY MOUTH ONCE DAILY,  Disp: 90 tablet, Rfl: 1  ???  carbidopa-levodopa (SINEMET) 25-100 MG per tablet, TAKE 1 TABLET BY MOUTH THREE TIMES DAILY, Disp: 270 tablet, Rfl: 1  ???  Fluticasone furoate-vilanterol (BREO ELLIPTA) 200-25 MCG/INH AEPB inhaler, Inhale 1 puff into the lungs daily, Disp: 3 each, Rfl: 1  ???  ramipril (ALTACE) 2.5 MG capsule, Take 1 capsule by mouth as needed (Hypertension), Disp: 30 capsule, Rfl: 0  ???  albuterol sulfate HFA (PROVENTIL;VENTOLIN;PROAIR) 108 (90 Base) MCG/ACT inhaler, Inhale 2 puffs into the lungs as needed for Shortness of Breath, Disp: 2 each, Rfl: 1  ???  spironolactone (ALDACTONE) 25 MG tablet, TAKE 1 TABLET BY MOUTH ONCE DAILY, Disp: 90 tablet, Rfl: 1  ???  clobetasol (TEMOVATE) 0.05 % ointment, Apply topically 2 times daily., Disp: 15 g, Rfl: 0  ???  VITAMIN D PO, Take by mouth daily , Disp: , Rfl:   ???  loratadine (CLARITIN) 10 MG capsule, Take 10 mg by mouth as needed , Disp: , Rfl:   ???  acetaminophen (TYLENOL) 325 MG tablet, Take 650 mg by mouth every 6 hours as needed for Pain, Disp: , Rfl:      No past medical history on file.    Aeon was seen today for medication refill, shortness of breath, gastroesophageal reflux, chest pain, constipation, hair/scalp problem and cpap/bipap.    Diagnoses and all orders for this visit:    Chest pain, unspecified type  -     EKG 12 Lead    Gastroesophageal reflux disease with esophagitis, unspecified whether hemorrhage  -     pantoprazole (PROTONIX) 40 MG tablet; Take 1 tablet by mouth daily    Uncomplicated asthma, unspecified asthma severity, unspecified whether persistent  -     Fluticasone furoate-vilanterol (BREO ELLIPTA) 200-25 MCG/INH AEPB inhaler; Inhale 1 puff into the lungs daily    History of MI (myocardial infarction)  -     EKG 12 Lead    Other orders  -     torsemide (DEMADEX) 20 MG tablet; TAKE 1 TABLET BY MOUTH ONCE DAILY  -     tiotropium (SPIRIVA HANDIHALER) 18 MCG inhalation capsule; Inhale 1 capsule into the lungs daily  -     clopidogrel (PLAVIX) 75  MG tablet; TAKE 1 TABLET BY MOUTH ONCE DAILY  -     carbidopa-levodopa (SINEMET) 25-100 MG per tablet; TAKE 1 TABLET BY MOUTH THREE TIMES DAILY  -     ramipril (ALTACE) 2.5 MG capsule; Take 1 capsule by mouth as needed (Hypertension)  -     albuterol sulfate HFA (PROVENTIL;VENTOLIN;PROAIR) 108 (90 Base) MCG/ACT inhaler; Inhale 2 puffs into the lungs as needed for Shortness of Breath       Reviewed concerns  Spoke with daughter  Declines EMS  Patient wants to transport her to [facility] ER  I recommend closest, they understand and daughter will drive to [facility], [place]

## 2021-05-17 LAB — CBC WITH AUTO DIFFERENTIAL
Basophils %: 0.5 % (ref 0.0–2.0)
Basophils Absolute: 0.05 E9/L (ref 0.00–0.20)
Eosinophils %: 2.8 % (ref 0.0–6.0)
Eosinophils Absolute: 0.29 E9/L (ref 0.05–0.50)
Hematocrit: 41 % (ref 34.0–48.0)
Hemoglobin: 12.6 g/dL (ref 11.5–15.5)
Immature Granulocytes #: 0.05 E9/L
Immature Granulocytes %: 0.5 % (ref 0.0–5.0)
Lymphocytes %: 18.2 % — ABNORMAL LOW (ref 20.0–42.0)
Lymphocytes Absolute: 1.87 E9/L (ref 1.50–4.00)
MCH: 24.8 pg — ABNORMAL LOW (ref 26.0–35.0)
MCHC: 30.7 % — ABNORMAL LOW (ref 32.0–34.5)
MCV: 80.6 fL (ref 80.0–99.9)
MPV: 9.2 fL (ref 7.0–12.0)
Monocytes %: 5 % (ref 2.0–12.0)
Monocytes Absolute: 0.51 E9/L (ref 0.10–0.95)
Neutrophils %: 73 % (ref 43.0–80.0)
Neutrophils Absolute: 7.53 E9/L — ABNORMAL HIGH (ref 1.80–7.30)
Platelets: 294 E9/L (ref 130–450)
RBC: 5.09 E12/L (ref 3.50–5.50)
RDW: 15 fL (ref 11.5–15.0)
WBC: 10.3 E9/L (ref 4.5–11.5)

## 2021-05-17 LAB — TROPONIN
Troponin, High Sensitivity: 10 ng/L — ABNORMAL HIGH (ref 0–9)
Troponin, High Sensitivity: 9 ng/L (ref 0–9)

## 2021-05-17 LAB — EKG 12-LEAD
Atrial Rate: 62 {beats}/min
P Axis: 59 degrees
P-R Interval: 246 ms
Q-T Interval: 432 ms
QRS Duration: 134 ms
QTc Calculation (Bazett): 438 ms
R Axis: 128 degrees
T Axis: -31 degrees
Ventricular Rate: 62 {beats}/min

## 2021-05-17 LAB — BRAIN NATRIURETIC PEPTIDE
Pro-BNP: 249 pg/mL (ref 0–450)
Pro-BNP: 257 pg/mL (ref 0–450)

## 2021-05-17 LAB — ECHOCARDIOGRAM COMPLETE 2D W DOPPLER W COLOR: Left Ventricular Ejection Fraction: 53

## 2021-05-17 MED ORDER — CARBIDOPA-LEVODOPA 25-100 MG PO TABS
25-100 MG | Freq: Three times a day (TID) | ORAL | Status: DC
Start: 2021-05-17 — End: 2021-05-18
  Administered 2021-05-17 – 2021-05-18 (×2): 1 via ORAL

## 2021-05-17 MED ORDER — ASPIRIN 81 MG PO CHEW
81 MG | Freq: Once | ORAL | Status: AC
Start: 2021-05-17 — End: 2021-05-17
  Administered 2021-05-17: 10:00:00 324 mg via ORAL

## 2021-05-17 MED ORDER — PANTOPRAZOLE SODIUM 40 MG PO TBEC
40 MG | Freq: Every day | ORAL | Status: DC
Start: 2021-05-17 — End: 2021-05-17

## 2021-05-17 MED ORDER — PERFLUTREN LIPID MICROSPHERE IV SUSP
Freq: Once | INTRAVENOUS | Status: AC | PRN
Start: 2021-05-17 — End: 2021-05-17
  Administered 2021-05-17: 20:00:00 1.65 mL via INTRAVENOUS

## 2021-05-17 MED ORDER — ARFORMOTEROL TARTRATE 15 MCG/2ML IN NEBU
15 MCG/2ML | Freq: Two times a day (BID) | RESPIRATORY_TRACT | Status: DC
Start: 2021-05-17 — End: 2021-05-18
  Administered 2021-05-17 – 2021-05-18 (×3): 15 ug via RESPIRATORY_TRACT

## 2021-05-17 MED ORDER — CETIRIZINE HCL 10 MG PO TABS
10 MG | Freq: Every day | ORAL | Status: DC
Start: 2021-05-17 — End: 2021-05-18

## 2021-05-17 MED ORDER — ALBUTEROL SULFATE (2.5 MG/3ML) 0.083% IN NEBU
RESPIRATORY_TRACT | Status: DC | PRN
Start: 2021-05-17 — End: 2021-05-18

## 2021-05-17 MED ORDER — BUDESONIDE 0.5 MG/2ML IN SUSP
0.5 MG/2ML | Freq: Two times a day (BID) | RESPIRATORY_TRACT | Status: DC
Start: 2021-05-17 — End: 2021-05-18
  Administered 2021-05-17 – 2021-05-18 (×3): 500 mg via RESPIRATORY_TRACT

## 2021-05-17 MED ORDER — RAMIPRIL 2.5 MG PO CAPS
2.5 MG | ORAL | Status: DC | PRN
Start: 2021-05-17 — End: 2021-05-17

## 2021-05-17 MED ORDER — DIGOXIN 0.25 MG/ML IJ SOLN
0.25 MG/ML | Freq: Once | INTRAMUSCULAR | Status: DC
Start: 2021-05-17 — End: 2021-05-17

## 2021-05-17 MED ORDER — TORSEMIDE 20 MG PO TABS
20 MG | Freq: Every day | ORAL | Status: DC
Start: 2021-05-17 — End: 2021-05-18
  Administered 2021-05-17 – 2021-05-18 (×2): 20 mg via ORAL

## 2021-05-17 MED ORDER — NITROGLYCERIN 0.4 MG SL SUBL
0.4 MG | Freq: Once | SUBLINGUAL | Status: DC
Start: 2021-05-17 — End: 2021-05-17

## 2021-05-17 MED ORDER — ACETAMINOPHEN 325 MG PO TABS
325 MG | Freq: Four times a day (QID) | ORAL | Status: DC | PRN
Start: 2021-05-17 — End: 2021-05-18

## 2021-05-17 MED ORDER — CLOPIDOGREL BISULFATE 75 MG PO TABS
75 MG | Freq: Every day | ORAL | Status: DC
Start: 2021-05-17 — End: 2021-05-18
  Administered 2021-05-17 – 2021-05-18 (×2): 75 mg via ORAL

## 2021-05-17 MED ORDER — RAMIPRIL 2.5 MG PO CAPS
2.5 MG | Freq: Every day | ORAL | Status: DC
Start: 2021-05-17 — End: 2021-05-18

## 2021-05-17 MED ORDER — MOMETASONE FURO-FORMOTEROL FUM 200-5 MCG/ACT IN AERO
200-5 MCG/ACT | Freq: Two times a day (BID) | RESPIRATORY_TRACT | Status: DC
Start: 2021-05-17 — End: 2021-05-17

## 2021-05-17 MED ORDER — SPIRONOLACTONE 25 MG PO TABS
25 MG | Freq: Every day | ORAL | Status: DC
Start: 2021-05-17 — End: 2021-05-18
  Administered 2021-05-18: 12:00:00 25 mg via ORAL

## 2021-05-17 MED FILL — ASPIRIN 81 MG PO CHEW: 81 mg | ORAL | Qty: 4

## 2021-05-17 MED FILL — RAMIPRIL 2.5 MG PO CAPS: 2.5 mg | ORAL | Qty: 1

## 2021-05-17 MED FILL — CARBIDOPA-LEVODOPA 25-100 MG PO TABS: 25-100 mg | ORAL | Qty: 1

## 2021-05-17 MED FILL — CETIRIZINE HCL 10 MG PO TABS: 10 mg | ORAL | Qty: 1

## 2021-05-17 MED FILL — BUDESONIDE 0.5 MG/2ML IN SUSP: 0.5 MG/2ML | RESPIRATORY_TRACT | Qty: 2

## 2021-05-17 MED FILL — TORSEMIDE 20 MG PO TABS: 20 mg | ORAL | Qty: 1

## 2021-05-17 MED FILL — CLOPIDOGREL BISULFATE 75 MG PO TABS: 75 mg | ORAL | Qty: 1

## 2021-05-17 MED FILL — BROVANA 15 MCG/2ML IN NEBU: 15 MCG/2ML | RESPIRATORY_TRACT | Qty: 2

## 2021-05-17 NOTE — H&P (Signed)
Hospital Medicine History & Physical      PCP: Salome Arnt, MD    Date of Admission: 05/17/2021    Date of Service: Pt seen/examined on 05/17/2021 and placed in Observation.    Chief Complaint: Chest pain/back pain      History Of Present Illness:     75 y.o. female who presented to [facility] with back pain.  Patient saw her PCP for routine visit.  At that time she mentioned that she had back pain.  Patient also has been having some exertional dyspnea for the last 2 weeks.  Patient was then sent into the emergency room by PCP.  Patient was then assigned observation status to evaluate this symptoms.  At the time of questioning patient is feeling okay      Past Medical History:          Diagnosis Date   ??? CAD (coronary artery disease)        Past Surgical History:      History reviewed. No pertinent surgical history.    Medications Prior to Admission:      Prior to Admission medications    Medication Sig Start Date End Date Taking? Authorizing Provider   torsemide (DEMADEX) 20 MG tablet TAKE 1 TABLET BY MOUTH ONCE DAILY 05/16/21   Salome Arnt, MD   tiotropium (SPIRIVA HANDIHALER) 18 MCG inhalation capsule Inhale 1 capsule into the lungs daily 05/16/21   Salome Arnt, MD   pantoprazole (PROTONIX) 40 MG tablet Take 1 tablet by mouth daily 05/16/21   Salome Arnt, MD   clopidogrel (PLAVIX) 75 MG tablet TAKE 1 TABLET BY MOUTH ONCE DAILY 05/16/21   Salome Arnt, MD   carbidopa-levodopa (SINEMET) 25-100 MG per tablet TAKE 1 TABLET BY MOUTH THREE TIMES DAILY 05/16/21   Salome Arnt, MD   Fluticasone furoate-vilanterol (BREO ELLIPTA) 200-25 MCG/INH AEPB inhaler Inhale 1 puff into the lungs daily 05/16/21   Salome Arnt, MD   ramipril (ALTACE) 2.5 MG capsule Take 1 capsule by mouth as needed (Hypertension) 05/16/21   Salome Arnt, MD   albuterol sulfate HFA (PROVENTIL;VENTOLIN;PROAIR) 108 (90 Base) MCG/ACT inhaler Inhale 2 puffs into the lungs as needed for  Shortness of Breath 05/16/21   Salome Arnt, MD   spironolactone (ALDACTONE) 25 MG tablet TAKE 1 TABLET BY MOUTH ONCE DAILY 01/17/21   Salome Arnt, MD   clobetasol (TEMOVATE) 0.05 % ointment Apply topically 2 times daily. 05/31/20   Salome Arnt, MD   VITAMIN D PO Take by mouth daily     Historical Provider, MD   loratadine (CLARITIN) 10 MG capsule Take 10 mg by mouth as needed     Historical Provider, MD   acetaminophen (TYLENOL) 325 MG tablet Take 650 mg by mouth every 6 hours as needed for Pain    Historical Provider, MD       Allergies:  Iodides, Amoxicillin-pot clavulanate, Apixaban, Sulfamethoxazole, and Vagisil anti-itch medicated [pramoxine hcl]    Social History:        TOBACCO:   reports that she has never smoked. She has never used smokeless tobacco.  ETOH:   reports previous alcohol use.      Family History:          History reviewed. No pertinent family history.    REVIEW OF SYSTEMS:   Pertinent positives as noted in the HPI. All other systems reviewed and negative.    PHYSICAL EXAM:  BP (!) 143/75    Pulse 59    Temp 97.5 ??F (36.4 ??C)    Resp 15    SpO2 97%     General appearance:  No apparent distress, appears stated age and cooperative.  HEENT:  Normal cephalic, atraumatic without obvious deformity. Pupils equal, round, and reactive to light.  Extra ocular muscles intact. Conjunctivae/corneas clear.  Neck: Supple, with full range of motion. No jugular venous distention. Trachea midline.  Respiratory:  Normal respiratory effort. Clear to auscultation, bilaterally without Rales/Wheezes/Rhonchi.  Cardiovascular:  Regular rate and rhythm with normal S1/S2 without murmurs, rubs or gallops.  Abdomen: Soft, non-tender, non-distended with normal bowel sounds.  Musculoskeletal:  No clubbing, cyanosis or edema bilaterally.  Full range of motion without deformity.  Skin: Skin color, texture, turgor normal.  No rashes or lesions.  Neurologic:  Neurovascularly intact without any focal sensory/motor  deficits. Cranial nerves: II-XII intact, grossly non-focal.  Psychiatric:  Alert and oriented, thought content appropriate, normal insight    *    Labs:     Recent Labs     05/16/21  1716   WBC 12.0*   HGB 13.3   HCT 42.6   PLT 321     Recent Labs     05/16/21  1716   NA 136   K 4.6   CL 102   CO2 27   BUN 22   CREATININE 1.2*   CALCIUM 8.9     No results for input(s): AST, ALT, BILIDIR, BILITOT, ALKPHOS in the last 72 hours.  Recent Labs     05/16/21  1716   INR 1.1     No results for input(s): CKTOTAL, TROPONINI in the last 72 hours.    Urinalysis:    No results found for: NITRU, WBCUA, BACTERIA, RBCUA, BLOODU, SPECGRAV, GLUCOSEU    EKG shows nonspecific changes    ASSESSMENT:    Active Hospital Problems    Diagnosis Date Noted   ??? 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

## 2021-05-17 NOTE — ED Notes (Signed)
Handoff report given to [employer] SBAR faxed.      Robet Leu, RN  05/17/21 [phone]

## 2021-05-17 NOTE — ED Notes (Signed)
Pt taking medications from home. Stated she did not want to pay for medications that she already has.     Sinemet 25-100 mg 1 tab po taken   Plavix 75 mg 1 tab po taken   Torsemide 20 mg 1 tab po Taken   Zyrtec refused at this time      Robet Leu, RN  05/17/21 1154

## 2021-05-17 NOTE — Care Coordination-Inpatient (Signed)
Social Work / Transition of Care:    Pt presents to the ED secondary to chest pain and shortness of breath.  Pt sent to ED from her PCP office yesterday (6/27).    SW met with pt and granddaughter.  Pt was sitting up in bed, alert and oriented x3.  Pt reports living alone in an apartment with no steps at the entrance.  Pt reports she has a cpap at home that she wears regularly.  Pt reported no other DME and no recent falls.  Pt reports she is mostly independent and still drives.  Pt's PCP is Dr Ramond Craver and she uses [facility] in [place] to fill her prescriptions.  Pt reports she does not have a cardiologist locally but does have an electrophysiologist at [facility] (Dr Kathe Mariner).  Pt plan is to return home upon discharge with no needs.    SW/CM to follow.

## 2021-05-17 NOTE — Consults (Signed)
Inpatient Cardiology Consultation      Reason for Consult:  SOB / elevated troponin    Consulting Physician: Dr Ladoris Gene    Requesting Physician:  Dr Loyola Mast    Date of Consultation: 05/17/2021    HISTORY OF PRESENT ILLNESS:   Caitlyn Spencer is a 75 yo female who is not previously known to [facility] cardiology.     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       Presented to [facility] 05/17/2021 with shortness of breath  VS: 97.5-69-16-90 7% RA-150/68  Labs: WBC 12.0, H&H 13.3/42.6, platelets 321.  K+ 4.6, BUN/CR 22/1.2, glucose 111.  Troponin 9--10    CT chest: No acute pathology    She was given aspirin 325 mg. Cardiology was asked see the patient for further recommendations and management of elevated troponin.    She states that over the last couple of weeks she has had progressive shortness of breath that she attributed to her asthma and the temperature.  She states that she had a burning sensation in her midsternal that lasted all night that radiated to her shoulder.  She took her blood pressure and it was high so she decided to come to the hospital for further evaluation.    Please note: past medical records were reviewed per electronic medical record (EMR) - see detailed reports under Past Medical/ Surgical History.     Past Medical History:    1. Paroxysmal atrial fibrillation (dx 2011) s/p ILR placement in 03/2017 with removal 11/2017 (was not placed  properly)  2. ZIO 08/2018 ([facility]): AF episodes (1% burden) with HR ranging from 61-163 bpm and the longest episode lasting 4 hours, 21 minutes.   3. H/o GI bleed - no OCA  --recommended PVI  and watchman at [facility] (while maintained on Plavix/warfarin)  4. 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 Iatrogenic left femoral tear (17 cm - during Impella placement) w/ vascular repair (c/b wound vac and sepsis)  6. HTN  7. OSA with CPAP  8. Asthma  9. Parkinson's disease  10. CHF      Medications Prior to admit:  Prior to Admission medications    Medication Sig Start Date End Date Taking? Authorizing Provider   torsemide (DEMADEX) 20 MG tablet TAKE 1 TABLET BY MOUTH ONCE DAILY 05/16/21   Salome Arnt, MD   tiotropium (SPIRIVA HANDIHALER) 18 MCG inhalation capsule Inhale 1 capsule into the lungs daily 05/16/21   Salome Arnt, MD   pantoprazole (PROTONIX) 40 MG tablet Take 1 tablet by mouth daily 05/16/21   Salome Arnt, MD   clopidogrel (PLAVIX) 75 MG tablet TAKE 1 TABLET BY MOUTH ONCE DAILY 05/16/21   Salome Arnt, MD   carbidopa-levodopa (SINEMET) 25-100 MG per tablet TAKE 1 TABLET BY MOUTH THREE TIMES DAILY 05/16/21   Salome Arnt, MD   Fluticasone furoate-vilanterol (BREO ELLIPTA) 200-25 MCG/INH AEPB inhaler Inhale 1 puff into the lungs daily 05/16/21   Salome Arnt, MD   ramipril (ALTACE) 2.5 MG capsule Take 1 capsule by mouth as needed (Hypertension) 05/16/21   Salome Arnt, MD  albuterol sulfate HFA (PROVENTIL;VENTOLIN;PROAIR) 108 (90 Base) MCG/ACT inhaler Inhale 2 puffs into the lungs as needed for Shortness of Breath 05/16/21   Salome Arnt, MD   spironolactone (ALDACTONE) 25 MG tablet TAKE 1 TABLET BY MOUTH ONCE DAILY 01/17/21   Salome Arnt, MD   clobetasol (TEMOVATE) 0.05 % ointment Apply topically 2 times daily. 05/31/20   Salome Arnt, MD   VITAMIN D PO Take by mouth daily     Historical Provider, MD   loratadine (CLARITIN) 10 MG capsule Take 10 mg by mouth as needed     Historical Provider, MD   acetaminophen (TYLENOL) 325 MG tablet Take 650 mg by mouth every 6 hours as needed for Pain    Historical Provider, MD        Current Medications:    Current Facility-Administered Medications: cetirizine (ZYRTEC) tablet 5 mg, 5 mg, Oral, Daily  spironolactone (ALDACTONE) tablet 25 mg, 1 tablet, Oral, Daily  torsemide (DEMADEX) tablet 20 mg, 1 tablet, Oral, Daily  clopidogrel (PLAVIX) tablet 75 mg, 1 tablet, Oral, Daily  carbidopa-levodopa (SINEMET) 25-100 MG per tablet 1 tablet, 1 tablet, Oral, TID  mometasone-formoterol (DULERA) 200-5 MCG/ACT inhaler 2 puff, 2 puff, Inhalation, BID  ramipril (ALTACE) capsule 2.5 mg, 2.5 mg, Oral, PRN  albuterol sulfate HFA (PROVENTIL;VENTOLIN;PROAIR) 108 (90 Base) MCG/ACT inhaler 2 puff, 2 puff, Inhalation, PRN  sodium chloride flush 0.9 % injection 10 mL, 10 mL, IntraVENous, PRN    Allergies:  Iodides, Amoxicillin-pot clavulanate, Apixaban, Sulfamethoxazole, and Vagisil anti-itch medicated [pramoxine hcl]    Social History:    Lives independently in an apartment.  Admits to shortness of breath/dyspnea on exertion with activities of daily living more recently.  Does not require supplemental oxygen or use ambulation assistance devices.    Denies tobacco, alcohol, illicit drug  Full code       Family History: This information was not obtained at this time as it is found noncontributory secondary to the patients advanced age.         REVIEW OF SYSTEMS:     ?? Constitutional: Denies fevers, chills, night sweats, and fatigue  ?? HEENT: Denies headaches, nose bleeds, and blurred vision,oral pain, abscess or lesion.  ?? Musculoskeletal: Denies falls, pain to BLE with ambulation and edema to BLE.  ?? Neurological: Denies dizziness and lightheadedness, numbness and tingling  ?? Cardiovascular: Denies chest pain, palpitations, and feelings of heart racing.   ?? Respiratory: Denies orthopnea and PND, cough,+ DOE  ?? Gastrointestinal: Denies heartburn, nausea/vomiting, diarrhea and constipation, black/bloody, and tarry stools.   ?? Genitourinary: Denies dysuria and hematuria  ?? Hematologic: Denies excessive bruising or  bleeding  ?? Lymphatic: Denies lumps and bumps to neck, axilla, breast, and groin      PHYSICAL EXAM:   BP (!) 143/75    Pulse 59    Temp 97.5 ??F (36.4 ??C)    Resp 15    SpO2 97%   CONST:  Well developed, Caucasian female who appears her stated age. Awake, alert, cooperative, no apparent distress  HEENT:   Head- Normocephalic, atraumatic   Eyes- Conjunctivae pink, anicteric  Throat- Oral mucosa pink and moist  Neck-  No stridor, trachea midline, no jugular venous distention.   CHEST: Chest symmetrical and non-tender to palpation.   RESPIRATORY: Lung sounds clear throughout fields bilaterally. No wheeze or rhonchi noted.   CARDIOVASCULAR:     No carotid bruit noted bilaterally   Heart Ausculation- Regular rate and rhythm, no murmur. No s3,  s4 or rub   PV: No lower extremity edema. No varicosities.  ABDOMEN: Soft, non-tender to light palpation. Bowel sounds present.  Caitlyn: Good muscle strength and tone. No atrophy or abnormal movements.   GU: Deferred   SKIN: Warm and dry no statis dermatitis or ulcers   NEURO / PSYCH: Oriented to person, place and time. Speech clear and appropriate. Follows all commands. Pleasant affect     DATA:    ECG: Sinus rhythm with first-degree AV block, heart rate 62.  T wave inversions in lateral leads and incomplete left bundle branch block.  Tele strips: Sinus rhythm  Diagnostic:      Labs:   CBC:   Recent Labs     05/16/21  1716   WBC 12.0*   HGB 13.3   HCT 42.6   PLT 321     BMP:   Recent Labs     05/16/21  1716   NA 136   K 4.6   CO2 27   BUN 22   CREATININE 1.2*   LABGLOM 44   CALCIUM 8.9     PT/INR:   Recent Labs     05/16/21  1716   PROTIME 11.6   INR 1.1     APTT:  Recent Labs     05/16/21  1716   APTT 29.1     FASTING LIPID PANEL:  Lab Results   Component Value Date    CHOL 217 04/29/2020    HDL 48 04/29/2020    LDLCALC 127 04/29/2020    TRIG 212 04/29/2020         Assessment and plan per Dr Ladoris GeneBallas   Electronically signed by Charlesetta ShanksVictoria Puckett, PA on 05/17/2021 at 10:47 AM     The above  documentation has been prepared under my direction and personally reviewed by me in its entirety. I confirm that the note above accurately reflects all work, treatment, procedures, and medical decision making performed by me.    The patient's history was independently obtained. The patient was independently examined. Electrocardiogram, prior and present cardiovascular assessment, and laboratory studies were reviewed.    The patient is a 75 year old white female with no association to [facility].  She has a known history of paroxysmal atrial fibrillation presently maintained in sinus rhythm with no present anticoagulation on the basis of reported bleeding concerns.  She additionally has a history of chronic diastolic heart failure in the face of mild left ventricular systolic dysfunction, hypertension, moderate obesity with associated obstructive sleep apnea and compliance with the use of nocturnal CPAP in addition to that of reported reactive airway disease.  Underwent coronary angiography in May, 2018 with iatrogenic dissection of the left main trunk requiring intervention with complications of femoral dissection during attempted Impella placement.  At the time of her most recent evaluation, an echocardiogram the [facility] in November, 2020 demonstrated evidence of a mildly dilated left ventricular chamber with mild left ventricular systolic dysfunction estimated ejection fraction of approximately 45-50% with stage II diastolic dysfunction and moderate left atrial enlargement.  Mild to moderate mitral regurgitation and aortic insufficiency were present with mild pulmonary hypertension and right ventricular systolic pressures of approximately 40 mmHg.  Arrhythmia monitoring in October, 2019 demonstrated an atrial fibrillation burden of 1% with the longest episode lasting approximately 4.5 hours with  subsequent arrhythmia monitoring 1 year later demonstrating no evidence of residual atrial arrhythmias.  She  subsequently relates symptoms of chronic (functional class II) exertional dyspnea in the absence of additional manifestations of decompensated left ventricular systolic dysfunction or volume overload with occasional periods of palpitations in the absence of additional arrhythmia related symptoms.  She denies subsequent bleeding in the face of antiplatelet therapy.  She relates noting mild increase in her dyspnea over the past several weeks felt attributed to exacerbation of her reactive airway disease and improved with steroids, albeit by her report not back to that of her baseline with the subsequent development of a somewhat ill-defined burning discomfort of her midsternal and precordial distribution beginning the day prior to assessment of a continuous nature and unrelated to activity, position or respiration.  At the time of her emergency room evaluation, serial resting electrocardiographic tracings reviewed at time of evaluation demonstrated evidence of sinus rhythm with a first-degree atrioventricular block and PR interval approximately 245 Caitlyn left bundle branch block conduction patterns and on the second tracing limb lead reversal as well as a chest x-ray demonstrating no evidence of cardiomegaly or infiltrate upon review.  High-sensitivity troponin levels were normal with a proBNP level of 250 pg/mL significantly decreased from that at the time of most recent evaluation.    At the time of evaluation, the patient's medications and allergies were reviewed as well as that of her past medical history and review of systems document.    On examination, she appears in no discomfort nor distress and is hemodynamically stable with vital signs as documented.  Jugular venous pressure appears normal with no identified carotid bruits.  Lung fields are clear to auscultation.  Cardiac examination is notable for a  regular rate and rhythm with no gallop rhythm nor cardiac murmur.  A benign abdominal examination is present with exception of obesity and chronic lymphedema is present.    Diagnostic Assessment and Plan: On a clinical basis, the patient presents with noncardiac chest discomfort in the face of reported chronic diastolic heart failure and mild left ventricular systolic dysfunction with no present significant clinical evidence of volume overload nor objective elevation of proBNP levels noted.  She presently remains in sinus rhythm with evidence of conduction system abnormalities of a chronic nature limiting the use of a beta-blocker within her medical regimen and at present only the as needed usage of her angiotensin-converting enzyme inhibitor.  Presently, routine administration of her [place] has been recommended with continuation of additional components of her existing medical regimen.  In addition, time of evaluation based on her embolic risk and a CHA2DS2-VASc risk score of a minimum of 4, oral anticoagulation has been discussed and recommended to reduce risk of embolic events with consideration of a watchman device if recurrent bleeding is noted.  A repeat echocardiogram will be obtained for further assessment of left ventricular systolic and diastolic as well as that of right ventricular function to guide additional management recommendations.  Appropriate lifestyle modification to achieve weight reduction will benefit diastolic cardiac performance and assist in management for obstructive sleep apnea with recommended reassessment of the appropriateness of her present nocturnal CPAP settings based on significant weight gain since her most recent evaluation.  Ongoing appropriate risk factor modification of blood pressure and serum lipids will be essential to reducing risk of future atherosclerotic development.        Thank you for allowing me to participate in your patient's care. Please feel free to contact me  if you have any questions or concerns.    Helen Hashimoto. Ladoris Gene, MD  [facility]  Cardiology

## 2021-05-18 DIAGNOSIS — R0789 Other chest pain: Secondary | ICD-10-CM

## 2021-05-18 MED ORDER — PANTOPRAZOLE SODIUM 40 MG PO TBEC
40 MG | Freq: Every day | ORAL | Status: DC
Start: 2021-05-18 — End: 2021-05-18
  Administered 2021-05-18: 12:00:00 40 mg via ORAL

## 2021-05-18 MED ORDER — PANTOPRAZOLE SODIUM 40 MG PO TBEC
40 MG | ORAL_TABLET | Freq: Every day | ORAL | 3 refills | Status: AC
Start: 2021-05-18 — End: ?

## 2021-05-18 MED ORDER — ALUM & MAG HYDROXIDE-SIMETH 200-200-20 MG/5ML PO SUSP
200-200-20 MG/5ML | Freq: Four times a day (QID) | ORAL | Status: DC | PRN
Start: 2021-05-18 — End: 2021-05-18
  Administered 2021-05-18: 03:00:00 30 mL via ORAL

## 2021-05-18 MED FILL — PANTOPRAZOLE SODIUM 40 MG PO TBEC: 40 mg | ORAL | Qty: 1

## 2021-05-18 MED FILL — SPIRONOLACTONE 25 MG PO TABS: 25 mg | ORAL | Qty: 1

## 2021-05-18 MED FILL — CARBIDOPA-LEVODOPA 25-100 MG PO TABS: 25-100 mg | ORAL | Qty: 1

## 2021-05-18 MED FILL — ALUMINA-MAGNESIA-SIMETHICONE 200-200-20 MG/5ML PO SUSP: 200-200-20 MG/5ML | ORAL | Qty: 30

## 2021-05-18 NOTE — Discharge Summary (Signed)
Hospitalist Discharge Summary    Patient ID:  Caitlyn Spencer  [id number]  75 y.o.  October 18, 1946    Admit date: 05/17/2021    Discharge date: 05/18/2021    Disposition: Back to home    Admission Diagnoses:   Patient Active Problem List   Diagnosis   ??? Chronic anticoagulation   ??? Chronic diastolic (congestive) heart failure (HCC)   ??? GERD (gastroesophageal reflux disease)   ??? GI bleed   ??? Hypertension   ??? Reactive airway disease   ??? Obstructive sleep apnea   ??? Parkinson's disease (HCC)   ??? Obesity, Class II, BMI 35-39.9   ??? Nonrheumatic aortic valve insufficiency   ??? Left main coronary artery dissection   ??? Paroxysmal atrial fibrillation (HCC)   ??? 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 Stage 3a chronic kidney disease (HCC)   ??? Atypical chest pain   ??? Nonischemic cardiomyopathy (HCC)   ??? Left bundle branch block   ??? Moderate obesity       Discharge Diagnoses: Principal Problem:    Atypical chest pain  Active Problems:    Chronic diastolic (congestive) heart failure (HCC)    Reactive airway disease    Obstructive sleep apnea  Resolved Problems:    * No resolved hospital problems. *      Code Status:  Prior    Condition:  Stable     Discharge Diet: Diet:  No diet orders on file      Hospital Course:   75 y.o. female who presented to [facility] with back pain.  Patient saw her PCP for routine visit.  At that time she mentioned that she had back pain.  Patient also has been having some exertional dyspnea for the last 2 weeks.  Patient was then sent into the emergency room by PCP.  Patient was then assigned observation status to evaluate this symptoms.    Assessment  1. Chest pain  2. Paroxysmal atrial fibrillation (dx 2011) s/p ILR placement in 03/2017 with removal 11/2017 (was not placed  properly). ZIO 08/2018 ([facility]): AF episodes (1% burden) with HR ranging from 61-163 bpm and the longest episode lasting 4 hours, 21 minutes.   3. H/o GI bleed - no OCA  --recommended PVI and  watchman at [facility] (while maintained on Plavix/warfarin)  4. 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 Iatrogenic left femoral tear (17 cm - during Impella placement) w/ vascular repair (c/b wound vac and sepsis)  6. CHF  7. HTN  8. OSA with CPAP  9. Parkinson's disease  10. Asthma  11. Patient is a full code    Plan  ?? Echo was done which showed , echocardiographic evidence of left ventricular systolic function at the lower limits of normal with associated diastolic dysfunction and mild to moderate mitral regurgitation similar to that of previous findings.  ?? Detailed discussion done regarding anticoagulation, patient has history of GI bleed and she is scared of bleeding again and she does not want to proceed with anticoagulation and she accepts the increased risk of stroke with her atrial fibrillation but she does not want to take anything for prophylaxis  ?? Patient currently stable symptom-free, patient to follow with primary care physician after discharge             Discharge Medications:   Discharge Medication List as of 05/18/2021  1:12 PM        Discharge Medication List  as of 05/18/2021  1:12 PM      CONTINUE these medications which have CHANGED    Details   pantoprazole (PROTONIX) 40 MG tablet Take 1 tablet by mouth daily, Disp-30 tablet, R-3Normal           Discharge Medication List as of 05/18/2021  1:12 PM      CONTINUE these medications which have NOT CHANGED    Details   torsemide (DEMADEX) 20 MG tablet TAKE 1 TABLET BY MOUTH ONCE DAILY, Disp-90 tablet, R-1Normal      tiotropium (SPIRIVA HANDIHALER) 18 MCG inhalation capsule Inhale 1 capsule into the lungs daily, Disp-90 capsule, R-1Normal      clopidogrel (PLAVIX) 75 MG tablet TAKE 1 TABLET BY MOUTH ONCE DAILY, Disp-90 tablet, R-1Normal      carbidopa-levodopa (SINEMET) 25-100 MG per tablet TAKE 1 TABLET BY MOUTH THREE TIMES DAILY, Disp-270 tablet, R-1**Patient requests 90 days supply**Normal      Fluticasone  furoate-vilanterol (BREO ELLIPTA) 200-25 MCG/INH AEPB inhaler Inhale 1 puff into the lungs daily, Disp-3 each, R-1Normal      ramipril (ALTACE) 2.5 MG capsule Take 1 capsule by mouth as needed (Hypertension), Disp-30 capsule, R-0Normal      albuterol sulfate HFA (PROVENTIL;VENTOLIN;PROAIR) 108 (90 Base) MCG/ACT inhaler Inhale 2 puffs into the lungs as needed for Shortness of Breath, Disp-2 each, R-1Normal      spironolactone (ALDACTONE) 25 MG tablet TAKE 1 TABLET BY MOUTH ONCE DAILY, Disp-90 tablet, R-1Normal      clobetasol (TEMOVATE) 0.05 % ointment Apply topically 2 times daily., Disp-15 g, R-0, Normal      VITAMIN D PO Take by mouth daily Historical Med      loratadine (CLARITIN) 10 MG capsule Take 10 mg by mouth as needed Historical Med      acetaminophen (TYLENOL) 325 MG tablet Take 650 mg by mouth every 6 hours as needed for PainHistorical Med           Discharge Medication List as of 05/18/2021  1:12 PM                Assessment on Discharge: Stable, improved       Discharge Exam:  BP (!) 123/53    Pulse 63    Temp 96.8 ??F (36 ??C) (Temporal)    Resp 19    SpO2 95%   Patient seen at bedside, no acute distress, no obvious difficulty breathing and no respiratory distress  Patient awake alert responding to my questions and commands  Neurological examination: Awake and alert into 3, no focal neurological deficit noticed  Extremity examination: No peripheral edema, no erythema or rashes  Abdominal examination: Not distended, no pain reported        Pertinent Studies During Hospital Stay:  Radiology:  Echo Complete    Result Date: 05/17/2021  Transthoracic Echocardiography Report (TTE)  Demographics   Patient Name         Caitlyn Spencer Gender                Female   Medical Record       [id number]    Room Number           10  Number   Account #            [id number]   Procedure Date        05/17/2021   Corporate ID  Ordering Physician    Wyline Mood DO   Accession Number     [id number]  Referring  Physician   Date of Birth        1946-09-02  Sonographer           Marisa Hua,    Age                  34 year(s)  Interpreting          Cornelius Moras MD                                   Physician                                    Any Other  Procedure Type of Study   TTE procedure  Procedure Date Date: 05/17/2021 Start: 03:25 PM Study Location: Portable Technical Quality: Adequate visualization Indications:Congestive heart failure, diastolic dysfunction. Patient Status: Routine Contrast Medium: Definity. Height: 65 inches Weight: 232 pounds BSA: 2.11 m^2 BMI: 38.61 kg/m^2 BP: 143/75 mmHg  Findings   Left Ventricle  Left ventricle is normal in size .  Moderate left ventricular concentric hypertrophy noted.  Abnormal (paradoxical) motion consistent with left bundle branch block.  Low normal left ventricular systolic function.  Ejection fraction is visually estimated at 50-55%.  There is doppler evidence of stage I diastolic dysfunction.   Right Ventricle  Normal right ventricular size and function.   Left Atrium  Left atrium was not clearly visualized.  Normal sized left atrium.  Interatrial septum appears intact.   Right Atrium  Normal right atrium size.   Mitral Valve  Normal mitral valve structure and function.   Tricuspid Valve  The tricuspid valve was not well visualized.   Aortic Valve  Structurally normal aortic valve.  Mild-to-moderate aortic regurgitation is noted.   Pulmonic Valve  Pulmonic valve is structurally normal.   Pericardial Effusion  No evidence of pericardial effusion.   Aorta  Aortic root dimension within normal limits.   Conclusions   Summary  Technically suboptimal and limited study.  Left ventricle is normal in size .  Moderate left ventricular concentric hypertrophy noted.  Abnormal (paradoxical) motion consistent with left bundle branch block.  Low normal left ventricular systolic function.  There is doppler evidence of stage I diastolic dysfunction.  Mild-to-moderate aortic  regurgitation is noted.   Signature   ----------------------------------------------------------------  Electronically signed by Cornelius Moras MD(Interpreting  physician) on 05/17/2021 04:48 PM  ----------------------------------------------------------------  M-Mode/2D Measurements & Calculations   LV Diastolic    LV Systolic Dimension: 3.5   AV Cusp Separation: 1.8 cmLA  Dimension: 4.6  cm                           Dimension: 3.9 cmAO Root  cm              LV Volume Diastolic: 98.7 ml Dimension: 3 cm  LV FS:23.9 %    LV Volume Systolic: 52.1 ml  LV PW           LV EDV/LV EDV Index: 98.7  Diastolic: 1.5  ml/47 ml/m^2LV ESV/LV ESV  cm              Index: 52.1 ml/5ml/ m^2     RV Diastolic Dimension: 2.8  LV PW Systolic: EF Calculated: 47.2 %        cm  1.8 cm          LV Mass Index: 135 l/min*m^2  Septum          LV Length: 7.9 cm            LA/Aorta: 1.3  Diastolic: 1.5                               Ascending Aorta: 2.9 cm  cm              LVOT: 2.1 cm                 LA volume/Index: 44.8 ml  Septum                                       /21.33ml/m^2  Systolic: 1.5                                RA Area: 13.9 cm^2  cm                                               IVC Expiration: 1.7 cm  LV Mass: 284.84  g  Doppler Measurements & Calculations   MV Peak E-Wave:   AV Peak Velocity: 1.85 m/s    LVOT Peak Velocity: 0.89  0.67 m/s          AV Peak Gradient: 13.62 mmHg  m/s  MV Peak A-Wave:   AV Mean Velocity: 1.33 m/s    LVOT Mean Velocity: 0.61  1.1 m/s           AV Mean Gradient: 8.1 mmHg    m/s  MV E/A Ratio:     AV VTI: 47.9 cm               LVOT Peak Gradient: 3.2  0.61              AV Area (Continuity):1.69     mmHgLVOT Mean Gradient:  MV Peak Gradient: cm^2                          1.7 mmHg  4.9 mmHg          AV Deceleration Time: 1846.3  Estimated RVSP: 24.7 mmHg  MV Mean Gradient: msec                          Estimated RAP:3 mmHg  2.3 mmHg          LVOT VTI: 23.4 cm  MV Mean Velocity: IVRT: 143 msec  0.72 m/s           Estimated PASP: 24.66 mmHg    TR Velocity:2.33 m/s  MV Deceleration   Pulm. Vein A Reversal         TR Gradient:21.66 mmHg  Time: 200.7 msec  Duration:138.4 msec           PV Peak Velocity: 1.21 m/s  MV P1/2t: 60.9    Pulm. Vein D Velocity:0.38    PV Peak  Gradient: 5.83  msec              m/sPulm. Vein A Reversal      mmHg  MVA by PHT:3.61   Velocity:0.31 m/s             PV Mean Velocity: 0.74 m/s  cm^2              Pulm. Vein S Velocity: 0.64   PV Mean Gradient: 2.5 mmHg  MV Area           m/s  (continuity): 2.5  cm^2  MV E' Septal  Velocity: 0.05  m/s  MV E' Lateral  Velocity: 4 m/s  [email or web address]    CT CHEST WO CONTRAST    Result Date: 05/16/2021  EXAMINATION: CT OF THE CHEST WITHOUT CONTRAST 05/16/2021 8:46 pm TECHNIQUE: CT of the chest was performed without the administration of intravenous contrast. Multiplanar reformatted images are provided for review. Automated exposure control, iterative reconstruction, and/or weight based adjustment of the mA/kV was utilized to reduce the radiation dose to as low as reasonably achievable. COMPARISON: None. HISTORY: ORDERING SYSTEM PROVIDED HISTORY: poss dissection.  has severe iodine allergy. TECHNOLOGIST PROVIDED HISTORY: Reason for exam:->poss dissection.  has severe iodine allergy. Decision Support Exception - unselect if not a suspected or confirmed emergency medical condition->Emergency Medical Condition (MA) What reading provider will be dictating this exam?->CRC FINDINGS: Mediastinum: Evaluation of vascular structures severely limited due to noncontrast technique.  The thoracic aorta is normal in caliber.  No evidence of intramural hematoma.  Enlarged left thyroid lobe.  There is a moderate hiatal hernia. Lungs/pleura: The lungs are clear.  No pneumothorax or pleural effusion. Upper Abdomen:  Limited images of the upper abdomen demonstrate  no acute abnormality. Soft Tissues/Bones:  No acute abnormality of the visualized osseous structures.     Limited evaluation due to noncontrast technique.  No evidence acute aortic abnormality.         Last Labs on Discharge:   No results found for this or any previous visit (from the past 24 hour(s)).      Follow up:  Salome ArntMICHELLE R DOCKRY, MD  Future Appointments   Date Time Provider Department Center   07/07/2021  8:00 AM Nigel BridgemanSteven L Ballas, MD [facility]       Note that over 30 minutes was spent in preparing discharge papers, discussing discharge with patient, medication review, etc.    Thank you Salome ArntMICHELLE R DOCKRY, MD for the opportunity to be involved in this patient's care. If you have any questions or concerns please feel free to contact me at [phone].    Electronically signed by Pierce Craneanial Shi Blankenship, MD on 05/18/2021 at 7:43 PM    NOTE: This report was transcribed using voice recognition software. Every effort was made to ensure accuracy; however, inadvertent computerized transcription errors may be present.

## 2021-05-18 NOTE — Plan of Care (Signed)
Problem: Discharge Planning  Goal: Discharge to home or other facility with appropriate resources  05/18/2021 0952 by Merrie Roof, RN  Outcome: Progressing  05/17/2021 2340 by Konrad Felix Sample, RN  Outcome: Progressing     Problem: Chronic Conditions and Co-morbidities  Goal: Patient's chronic conditions and co-morbidity symptoms are monitored and maintained or improved  Outcome: Progressing     Problem: ABCDS Injury Assessment  Goal: Absence of physical injury  Outcome: Progressing

## 2021-05-18 NOTE — Progress Notes (Signed)
INPATIENT CARDIOLOGY FOLLOW-UP    Name: Caitlyn Spencer    Age: 75 y.o.    Date of Admission: 05/17/2021  4:34 AM    Date of Service: 05/18/2021    Chief Complaint: Follow-up for noncardiac chest pain, nonischemic cardiomyopathy, chronic diastolic heart failure, paroxysmal atrial fibrillation, hypertension, moderate obesity, obstructive sleep apnea    Interim History: The patient remains compensated from cardiovascular standpoint with no complaints of further chest discomfort or dyspnea and her only present complaint that of pruritus. Her echocardiogram demonstrates a normal-sized left ventricular chamber with concentric left ventricular hypertrophy and abnormal septal motion consistent with that of her left bundle branch block conduction pattern with left ventricular systolic function at the lower limits of normal and stage I diastolic dysfunction with mild to moderate aortic insufficiency.  He presently has tolerated the resumption of routine administration of her angiotensin-converting enzyme inhibitor.  She remains somewhat reluctant to anticoagulation recommendations      Review of Systems:   The remainder of a complete multisystem review including consitutional, central nervous, respiratory, circulatory, gastrointestinal, genitourinary, endocrinologic, hematologic, musculoskeletal and psychiatric are negative.    Problem List:  Patient Active Problem List   Diagnosis   ??? Chronic anticoagulation   ??? Chronic diastolic (congestive) heart failure (HCC)   ??? GERD (gastroesophageal reflux disease)   ??? GI bleed   ??? Hypertension   ??? Reactive airway disease   ??? Obstructive sleep apnea   ??? Parkinson's disease (HCC)   ??? Obesity, Class II, BMI 35-39.9   ??? Nonrheumatic aortic valve insufficiency   ??? Left main coronary artery dissection   ??? Paroxysmal atrial fibrillation (HCC)   ??? 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 Stage 3a chronic kidney disease (HCC)   ??? Atypical chest pain   ??? Nonischemic cardiomyopathy (HCC)   ??? Left  bundle branch block   ??? Moderate obesity       Allergies:  Allergies   Allergen Reactions   ??? Iodides      Pre-medications do not help with reaction   ??? Amoxicillin-Pot Clavulanate Swelling     Lips & Tongue   ??? Apixaban Swelling     Edema and petichea   ??? Sulfamethoxazole    ??? Vagisil Anti-Itch Medicated [Pramoxine Hcl]        Current Medications:  Current Facility-Administered Medications   Medication Dose Route Frequency Provider Last Rate Last Admin   ??? cetirizine (ZYRTEC) tablet 5 mg  5 mg Oral Daily Loyola Mast, MD       ??? acetaminophen (TYLENOL) tablet 650 mg  650 mg Oral Q6H PRN Loyola Mast, MD       ??? spironolactone (ALDACTONE) tablet 25 mg  25 mg Oral Daily Rula Khreis, MD   25 mg at 05/18/21 0751   ??? torsemide (DEMADEX) tablet 20 mg  20 mg Oral Daily Loyola Mast, MD   20 mg at 05/18/21 0751   ??? clopidogrel (PLAVIX) tablet 75 mg  75 mg Oral Daily Loyola Mast, MD   75 mg at 05/18/21 0750   ??? carbidopa-levodopa (SINEMET) 25-100 MG per tablet 1 tablet  1 tablet Oral TID Rula Khreis, MD   1 tablet at 05/18/21 0752   ??? albuterol (PROVENTIL) nebulizer solution 2.5 mg  2.5 mg Nebulization PRN Loyola Mast, MD       ??? budesonide (PULMICORT) nebulizer suspension 500 mcg  0.5 mg Nebulization BID Loyola Mast, MD   500 mcg at  05/18/21 0805    And   ??? Arformoterol Tartrate (BROVANA) nebulizer solution 15 mcg  15 mcg Nebulization BID Loyola Mast, MD   15 mcg at 05/18/21 0804   ??? ramipril (ALTACE) capsule 2.5 mg  2.5 mg Oral Daily Rula Khreis, MD       ??? pantoprazole (PROTONIX) tablet 40 mg  40 mg Oral Daily Rula Khreis, MD   40 mg at 05/18/21 0750   ??? aluminum & magnesium hydroxide-simethicone (MAALOX) 200-200-20 MG/5ML suspension 30 mL  30 mL Oral Q6H PRN Rula Khreis, MD   30 mL at 05/17/21 2240   ??? sodium chloride flush 0.9 % injection 10 mL  10 mL IntraVENous PRN Norva Riffle, PA             Physical Exam:  BP (!) 121/54    Pulse 75    Temp 97.2 ??F (36.2 ??C)  (Temporal)    Resp 16    SpO2 94%   Weight change:   Wt Readings from Last 3 Encounters:   05/16/21 232 lb (105.2 kg)   09/09/20 229 lb (103.9 kg)   05/31/20 229 lb (103.9 kg)     The patient is awake, alert and in no discomfort or distress. No gross musculoskeletal deformity is present. No significant skin or nail changes are present. Gross examination of head, eyes, nose and throat are negative. Jugular venous pressure is normal and no carotid bruits are present. Normal respiratory effort is noted with no accessory muscle usage present. Lung fields are clear to ascultation. Cardiac examination is notable for a regular rate and rhythm with no palpable thrill. No gallop rhythm or cardiac murmur are identified. A benign abdominal examination is present with exception of obesity and no masses or organomegaly. Intact pulses are present throughout all extremities and chronic lymphedema is present. No focal neurologic deficits are present.    Intake/Output:  No intake or output data in the 24 hours ending 05/18/21 0825  No intake/output data recorded.    Laboratory Tests:  Lab Results   Component Value Date    CREATININE 1.2 (H) 05/16/2021    BUN 22 05/16/2021    NA 136 05/16/2021    K 4.6 05/16/2021    CL 102 05/16/2021    CO2 27 05/16/2021     No results for input(s): CKTOTAL, CKMB in the last 72 hours.    Invalid input(s): TROPONONI  No results found for: BNP  Lab Results   Component Value Date    WBC 10.3 05/17/2021    RBC 5.09 05/17/2021    HGB 12.6 05/17/2021    HCT 41.0 05/17/2021    MCV 80.6 05/17/2021    MCH 24.8 05/17/2021    MCHC 30.7 05/17/2021    RDW 15.0 05/17/2021    PLT 294 05/17/2021    MPV 9.2 05/17/2021     No results for input(s): ALKPHOS, ALT, AST, PROT, BILITOT, BILIDIR, LABALBU in the last 72 hours.  No results found for: MG  Lab Results   Component Value Date    PROTIME 11.6 05/16/2021    INR 1.1 05/16/2021     Lab Results   Component Value Date    TSH 0.947 04/29/2020     No components found for:  CHLPL  Lab Results   Component Value Date    TRIG 212 (H) 04/29/2020     Lab Results   Component Value Date    HDL 48 04/29/2020     Lab Results  Component Value Date    LDLCALC 127 (H) 04/29/2020       Cardiac Tests:  Telemetry findings reviewed: sinus rhythm with a first-degree atrioventricular block, no new tachy/bradyarrhythmias overnight  Last Echocardiogram: An echocardiogram while somewhat technically suboptimal demonstrates evidence of a normal-sized left ventricular chamber with moderate concentric left ventricular hypertrophy and abnormal septal motion in the face of a left bundle branch block conduction pattern with a left ventricular systolic function at the lower limits of normal estimated ejection fraction of approximately 50-55% with stage I diastolic dysfunction and mild to moderate aortic insufficiency      ASSESSMENT / PLAN: On clinical basis, the patient is compensated from a cardiovascular standpoint with no active cardiovascular symptoms and echocardiographic evidence of left ventricular systolic function at the lower limits of normal with associated diastolic dysfunction and mild to moderate mitral regurgitation similar to that of previous findings.  On this basis, medical management remains advisable and time of evaluation extensive discussion was held regarding the appropriate routine administration of her angiotensin-converting enzyme inhibitor in attempt to assist in stabilization of blood pressure and cardiac function with present addition of a beta-blocker contraindicated in the face of her associated atrioventricular block.  In addition, a further discussion was held regarding her embolic risk and recommendations of anticoagulation with the consideration of a Watchman device if recurrent bleeding he is documented and at this time she is not yet willing to consent.  If agreeable, the use of a direct oral anticoagulant would be advisable.  Continued appropriate lifestyle modification to  achieve weight reduction will benefit diastolic cardiac performance as well as assist in management for obstructive sleep apnea with needs of repeat formal assessment to determine if further optimization of her present settings are indicated.  Appropriate risk factor modification of blood pressure and serum lipids will remain essential to reducing risk of future atherosclerotic development.  Presently no additional cardiovascular assessment is indicated during the course of hospitalization and we will further evaluate her should additional cardiovascular difficulties or concerns arise with arrangements made for follow-up on an outpatient basis in approximately 1 month.    The duration of discussion counseling in conjunction with the present encounter exceeded 35 minutes      Note: This report was completed utilizing computer voice recognition software. Every effort has been made to ensure accuracy, however; inadvertent computerized transcription errors may be present.    Helen Hashimoto. Ladoris Gene, MD  [facility]

## 2021-05-18 NOTE — Progress Notes (Signed)
Pt d/c instructions reviewed with pt, verbalized understanding. No new meds at this time. Pt states she has Protonix at home. Iv site to RUE removed, pressure held and hemostasis achieved, guaze and tape applied. Pt awaiting daughter for pick up, and in good condition at time of d/c. Pt also would like nursing and pharmacy to acknowledge that she used her own home meds, and not ours, as to not incur a charge. Pt advised that meds given were hers and none were received from pharmacy or scanned.

## 2021-05-18 NOTE — Plan of Care (Signed)
Patient's chart updated to reflect:      .    - HF care plan, HF education points and HF discharge instructions.  -Orders: 2 gram sodium diet, daily weights, I/O.  -PCP and/or Cardiologist appointment to be scheduled within 7 days of hospital discharge.  -History of HF no present significant clinical evidence of volume overload nor objective elevation of proBNP levels noted , not primary admission Dx.  Patient admitted for treatment of Diagnosis:  1. Chest pain, unspecified type        Samson Frederic, RN RN, BSN  Heart Failure Navigator

## 2021-05-18 NOTE — Discharge Instructions (Signed)
Constipation: Care Instructions  Overview     Constipation means that you have a hard time passing stools (bowel movements). People pass stools from 3 times a day to once every 3 days. What is normal for you may be different. Constipation may occur with pain in the rectum and cramping. The pain may get worse when you try to pass stools. Sometimes there are small amounts of bright red blood on toilet paper or the surface of stools.This is because of enlarged veins near the rectum (hemorrhoids).  A few changes in your diet and lifestyle may help you avoid ongoingconstipation. Your doctor may also prescribe medicine to help loosen your stool.  Some medicines can cause constipation. These include pain medicines and antidepressants. Tell your doctor about all the medicines you take. Your doctormay want to make a medicine change to ease your symptoms.  Follow-up care is a key part of your treatment and safety. Be sure to make and go to all appointments, and call your doctor if you are having problems. It's also a good idea to know your test results and keep alist of the medicines you take.  How can you care for yourself at home?  Drink plenty of fluids. If you have kidney, heart, or liver disease and have to limit fluids, talk with your doctor before you increase the amount of fluids you drink.  Include high-fiber foods in your diet each day. These include fruits, vegetables, beans, and whole grains.  Get at least 30 minutes of exercise on most days of the week. Walking is a good choice. You also may want to do other activities, such as running, swimming, cycling, or playing tennis or team sports.  Take a fiber supplement, such as Citrucel or Metamucil, every day. Read and follow all instructions on the label.  Schedule time each day for a bowel movement. A daily routine may help. Take your time having a bowel movement, but don't sit for more than 10 minutes at a time. And don't strain too much.  Support your feet  with a small step stool when you sit on the toilet. This helps flex your hips and places your pelvis in a squatting position.  Your doctor may recommend an over-the-counter laxative to relieve your constipation. Examples are Milk of Magnesia and MiraLax. Read and follow all instructions on the label. Do not use laxatives on a long-term basis.  When should you call for help?   Call your doctor now or seek immediate medical care if:    You have new or worse belly pain.     You have new or worse nausea or vomiting.     You have blood in your stools.   Watch closely for changes in your health, and be sure to contact your doctor if:    Your constipation is getting worse.     You do not get better as expected.   Where can you learn more?  Go to [email or web address] and sign in to your MyChart account. Enter P343 in the Search Health Information box to learn more about "Constipation: Care Instructions."     If you do not have an account, please click on the "Sign Up Now" link.  Current as of: January 26, 2021               Content Version: 13.3  ?? 2006-2022 [employer].   Care instructions adapted under license by [facility]. If you have questions about a medical condition or this instruction,  always ask your healthcare professional. [employer] disclaims any warranty or liability for your use of this information.         Starting a Weight Loss Plan: Care Instructions  Overview     If you're thinking about losing weight, it can be hard to know where to start. Your doctor can help you set up a weight loss plan that best meets your needs. You may want to take a class on nutrition or exercise, or you could join a weight loss support group. If you have questions about how to make changes to your eating or exercise habits, ask your doctor about seeing a registereddietitian or an exercise specialist.  It can be a big challenge to lose weight. But you don't have to make huge changes at  once. Make small changes, and stick with them. When those changesbecome habit, add a few more changes.  If you don't think you're ready to make changes right now, try to pick a date in the future. Make an appointment to see your doctor to discuss whether thetime is right for you to start a plan.  Follow-up care is a key part of your treatment and safety. Be sure to make and go to all appointments, and call your doctor if you are having problems. It's also a good idea to know your test results and keep alist of the medicines you take.  How can you care for yourself at home?  Set realistic goals. Many people expect to lose much more weight than is likely. A weight loss of 5% to 10% of your body weight may be enough to improve your health.  Get family and friends involved to provide support. Talk to them about why you are trying to lose weight, and ask them to help. They can help by participating in exercise and having meals with you, even if they may be eating something different.  Find what works best for you. If you do not have time or do not like to cook, a program that offers meal replacement bars or shakes may be better for you. Or if you like to prepare meals, finding a plan that includes daily menus and recipes may be best.  Ask your doctor about other health professionals who can help you achieve your weight loss goals.  A dietitian can help you make healthy changes in your diet.  An exercise specialist or personal trainer can help you develop a safe and effective exercise program.  A counselor or psychiatrist can help you cope with issues such as depression, anxiety, or family problems that can make it hard to focus on weight loss.  Consider joining a support group for people who are trying to lose weight. Your doctor can suggest groups in your area.  Where can you learn more?  Go to [email or web address] and sign in to your MyChart account. Enter [phone] in the Search Health Information box to  learn more about "Starting a Weight Loss Plan: Care Instructions."     If you do not have an account, please click on the "Sign Up Now" link.  Current as of: November 15, 2020               Content Version: 13.3  ?? 2006-2022 [employer].   Care instructions adapted under license by [facility]. If you have questions about a medical condition or this instruction, always ask your healthcare professional. [employer] disclaims any warranty or liability for your use of this information.

## 2021-05-18 NOTE — Discharge Instructions (Addendum)
Good nutrition is important when healing from an illness, injury, or surgery.  Follow any nutrition recommendations given to you during your hospital stay.   If you were given an oral nutrition supplement while in the hospital, continue to take this supplement at home.  You can take it with meals, in-between meals, and/or before bedtime. These supplements can be purchased at most local grocery stores, pharmacies, and chain super-stores.   If you have any questions about your diet or nutrition, call the hospital and ask for the dietitian.       Learning About Foods That Are Good Sources of Fiber  What foods are high in fiber?     The foods you eat contain nutrients, such as vitamins and minerals. Fiber is a nutrient. Your body needs the right amount to stay healthy and work as it should. You can use the list below to help you make choices about which foodsto eat.  Here are some examples of foods that are good sources of fiber.  Fruits  Apple  Apricot  Avocado  Banana  Blackberries  Cherries  Melon  Pear  Raspberries  Grains  Amaranth  Barley  Bran cereal  Farro  Oat bran  Oatmeal  Quinoa  Rice (brown or wild)  Whole-grain bread  Whole-grain English muffin  Protein foods  Almonds  Beans (black, kidney, navy, pinto)  Chia seeds  Garbanzo beans  Lentils  Pumpkin seeds  Split peas  Sunflower seeds  Vegetables  Artichoke  Broccoli  Brussels sprouts  Cabbage  Carrots  Cauliflower  Eggplant  Green peas  Kale  Pumpkin  Sweet potato  White potato  Work with your doctor to find out how much of this nutrient you need. Dependingon your health, you may need more or less of it in your diet.  Where can you learn more?  Go to [email or web address] and sign in to your MyChart account. Enter F355 in the Search Health Information box to learn more about "Learning About Foods That Are Good Sources of Fiber."     If you do not have an account, please click on the "Sign Up Now" link.  Current as of: July 28, 2020                Content Version: 13.3  ?? 2006-2022 [employer].   Care instructions adapted under license by [facility]. If you have questions about a medical condition or this instruction, always ask your healthcare professional. [employer] disclaims any warranty or liability for your use of this information.

## 2021-05-19 NOTE — Care Coordination-Inpatient (Signed)
[facility] Care Transitions Initial Follow Up Call    Call within 2 business days of discharge: Yes    Patient: Caitlyn Spencer Patient DOB: [birth year]   MRN: [id number]  Reason for Admission: CP  Discharge Date: 05/18/21 RARS: No data recorded    Last Discharge [facility]       Complaint Diagnosis Description Type Department Provider    05/17/21 Shortness of Breath; Chest Pain Chest pain, unspecified type ED to Hosp-Admission (Discharged) (ADMITTED) [id number] Pierce Crane, MD; Dub Amis...        Spoke with: Okey Regal     [facility]    Non-face-to-face services provided:  Scheduled appointment with PCP-DECLINED CTN SCHEDULING, STATED SHE WILL CALL THE OFFICE HERSELF TODAY.   Obtained and reviewed discharge summary and/or continuity of care documents    Care Transitions 24 Hour Call    Schedule Follow Up Appointment with PCP: Declined  Do you have a copy of your discharge instructions?: Yes  Do you have all of your prescriptions and are they filled?: Yes  Have you been contacted by a [facility] Pharmacist?: No  Have you scheduled your follow up appointment?: No  Do you feel like you have everything you need to keep you well at home?: Yes  Care Transitions Interventions     Spoke with Melena for initial observation status care transition call post hospital discharge. She reports that she is feeling "good so far" today. She denies CP, SOB, or pain in her back today. She is getting around her home without any concerns today.  Med review completed, 1111F entered. Okey Regal declined assistance with scheduling TCM visit, she stated she will call Dr. Estill Bakes office herself to schedule. Ashlan denies any needs, questions, or concerns at this time.     Follow Up  Future Appointments   Date Time Provider Department Center   07/07/2021  8:00 AM Nigel Bridgeman, MD [facility]       Ed Blalock, RN

## 2021-05-25 NOTE — Care Coordination-Inpatient (Addendum)
[facility] Health Care Transitions Follow Up Call    05/25/2021    Patient: Caitlyn Spencer  Patient DOB: [birth year]   MRN: [id number]  Reason for Admission: CP   Discharge Date: 05/18/21 RARS: No data recorded       Spoke with: [facility] Transitions Subsequent and Final Call    Schedule Follow Up Appointment with PCP: Declined  Subsequent and Final Calls  Do you have any ongoing symptoms?: No  Have your medications changed?: No  Do you have any questions related to your medications?: No  Do you currently have any active services?: No  Do you have any needs or concerns that I can assist you with?: No  Identified Barriers: None  Care Transitions Interventions  No Identified Needs  Other Interventions:       Spoke with Caitlyn Spencer for follow up care transition call. She reports that she is feeling "good" today, however, yesterday she was in afib with a rapid heart rate per her BP machine. She could not recall how high her heart rate was. She was very vague with her symptoms from yesterday and stated she could "feel it" and stated it did not last the entire day. She denies CP, SOB, dizziness, or pain in her back today. She did not notify her physicians. CTN tried to schedule a TCM last week and again today, however, she declined, stating she does not think it is necessary at this time. Caytlin denies any needs, questions, or concerns at this time.     Will route a message to Dr. Huntley Estelle to notify her of [name]'s reported afib yesterday and declining TCM visit.     Follow Up  Future Appointments   Date Time Provider Department Center   07/07/2021  8:00 AM Nigel Bridgeman, MD [facility]       Ed Blalock, RN

## 2021-05-31 NOTE — Care Coordination-Inpatient (Signed)
[facility] Health Care Transitions Follow Up Call    05/31/2021    Patient: Caitlyn Spencer  Patient DOB: 20-Jul-1946   MRN: [id number]  Reason for Admission: CP   Discharge Date: 05/18/21 RARS: No data recorded    Attempted to reach the patient for subsequent Care Transition call. Message left with CTN's contact information requesting return phone call.      Follow Up  Future Appointments   Date Time Provider Department Center   07/07/2021  8:00 AM Nigel Bridgeman, MD [facility]       Ed Blalock, RN

## 2021-06-07 NOTE — Care Coordination-Inpatient (Signed)
[facility] Health Care Transitions Follow Up Call    06/07/2021    Patient: Caitlyn Spencer  Patient DOB: Mar 31, 1946   MRN: [id number]  Reason for Admission: CP  Discharge Date: 05/18/21 RARS: No data recorded     Second attempt to reach patient by phone regarding follow up; Care Transitions, Subsequent call.   HIPAA compliant message left on patient's voicemail; CTN contact information provided.     Episode to be resolved and CTN to sign off if return call is not received from the patient.       Follow Up  Future Appointments   Date Time Provider Department Center   07/07/2021  8:00 AM Nigel Bridgeman, MD [facility]       Sheilah Mins, RN

## 2021-06-10 NOTE — Telephone Encounter (Signed)
error 

## 2021-06-10 NOTE — Telephone Encounter (Signed)
Pt called requesting Xopenex for her nebulizer. She states you did not prescribe this but it is the only one she can due to her "heart condition." When I asked who originally prescribed it she stated she was unsure and it was packed away because she is in the process of moving. She also states [facility] should be sending paperwork over for her CPAP.

## 2021-06-13 NOTE — Telephone Encounter (Signed)
This is redundant with albuterol.  She should not be using both.  Confirm she wants to stop albuterol and start Xopenex

## 2021-06-13 NOTE — Telephone Encounter (Signed)
Left vm to return call

## 2021-07-07 ENCOUNTER — Encounter: Payer: MEDICARE | Attending: Internal Medicine | Primary: Family Medicine

## 2021-07-11 MED ORDER — TORSEMIDE 20 MG PO TABS
20 MG | ORAL_TABLET | ORAL | 1 refills | Status: AC
Start: 2021-07-11 — End: ?

## 2021-07-11 MED ORDER — SPIRONOLACTONE 25 MG PO TABS
25 MG | ORAL_TABLET | ORAL | 1 refills | Status: AC
Start: 2021-07-11 — End: ?

## 2021-07-11 MED ORDER — CLOPIDOGREL BISULFATE 75 MG PO TABS
75 MG | ORAL_TABLET | ORAL | 1 refills | Status: AC
Start: 2021-07-11 — End: ?

## 2021-07-11 NOTE — Telephone Encounter (Signed)
Last Appointment:  05/16/2021  No future appointments.
